# Patient Record
Sex: Female | Born: 1970
Health system: Southern US, Community
[De-identification: ages and names within clinical notes are randomized; demographics above are authoritative.]

## PROBLEM LIST (undated history)

## (undated) DIAGNOSIS — IMO0002 Reserved for concepts with insufficient information to code with codable children: Secondary | ICD-10-CM

## (undated) DIAGNOSIS — K449 Diaphragmatic hernia without obstruction or gangrene: Secondary | ICD-10-CM

## (undated) DIAGNOSIS — K219 Gastro-esophageal reflux disease without esophagitis: Secondary | ICD-10-CM

## (undated) DIAGNOSIS — R87619 Unspecified abnormal cytological findings in specimens from cervix uteri: Secondary | ICD-10-CM

## (undated) DIAGNOSIS — R51 Headache: Secondary | ICD-10-CM

## (undated) DIAGNOSIS — B009 Herpesviral infection, unspecified: Secondary | ICD-10-CM

## (undated) HISTORY — DX: Reserved for concepts with insufficient information to code with codable children: IMO0002

## (undated) HISTORY — DX: Unspecified abnormal cytological findings in specimens from cervix uteri: R87.619

## (undated) HISTORY — DX: Herpesviral infection, unspecified: B00.9

---

## 1991-09-16 HISTORY — PX: CRYOTHERAPY: SHX1416

## 1998-12-03 ENCOUNTER — Other Ambulatory Visit: Admission: RE | Admit: 1998-12-03 | Discharge: 1998-12-03 | Payer: Self-pay | Admitting: Obstetrics and Gynecology

## 2000-11-30 ENCOUNTER — Other Ambulatory Visit: Admission: RE | Admit: 2000-11-30 | Discharge: 2000-11-30 | Payer: Self-pay | Admitting: Obstetrics and Gynecology

## 2001-06-25 ENCOUNTER — Ambulatory Visit (HOSPITAL_COMMUNITY): Admission: RE | Admit: 2001-06-25 | Discharge: 2001-06-25 | Payer: Self-pay | Admitting: Internal Medicine

## 2001-06-25 ENCOUNTER — Encounter: Payer: Self-pay | Admitting: Internal Medicine

## 2001-12-03 ENCOUNTER — Other Ambulatory Visit: Admission: RE | Admit: 2001-12-03 | Discharge: 2001-12-03 | Payer: Self-pay | Admitting: Obstetrics and Gynecology

## 2002-11-29 ENCOUNTER — Observation Stay (HOSPITAL_COMMUNITY): Admission: AD | Admit: 2002-11-29 | Discharge: 2002-11-30 | Payer: Self-pay | Admitting: Obstetrics and Gynecology

## 2003-04-07 ENCOUNTER — Observation Stay (HOSPITAL_COMMUNITY): Admission: RE | Admit: 2003-04-07 | Discharge: 2003-04-07 | Payer: Self-pay | Admitting: Obstetrics and Gynecology

## 2003-04-22 ENCOUNTER — Ambulatory Visit (HOSPITAL_COMMUNITY): Admission: AD | Admit: 2003-04-22 | Discharge: 2003-04-23 | Payer: Self-pay | Admitting: Obstetrics and Gynecology

## 2003-05-07 ENCOUNTER — Inpatient Hospital Stay (HOSPITAL_COMMUNITY): Admission: AD | Admit: 2003-05-07 | Discharge: 2003-05-08 | Payer: Self-pay | Admitting: Obstetrics and Gynecology

## 2003-09-16 HISTORY — PX: LEEP: SHX91

## 2004-12-09 ENCOUNTER — Other Ambulatory Visit: Admission: RE | Admit: 2004-12-09 | Discharge: 2004-12-09 | Payer: Self-pay | Admitting: Obstetrics and Gynecology

## 2005-01-01 ENCOUNTER — Other Ambulatory Visit: Admission: RE | Admit: 2005-01-01 | Discharge: 2005-01-01 | Payer: Self-pay | Admitting: Obstetrics and Gynecology

## 2005-09-26 ENCOUNTER — Other Ambulatory Visit: Admission: RE | Admit: 2005-09-26 | Discharge: 2005-09-26 | Payer: Self-pay | Admitting: Obstetrics & Gynecology

## 2006-02-10 ENCOUNTER — Other Ambulatory Visit: Admission: RE | Admit: 2006-02-10 | Discharge: 2006-02-10 | Payer: Self-pay | Admitting: Obstetrics & Gynecology

## 2006-06-05 ENCOUNTER — Other Ambulatory Visit: Admission: RE | Admit: 2006-06-05 | Discharge: 2006-06-05 | Payer: Self-pay | Admitting: Obstetrics & Gynecology

## 2006-06-08 ENCOUNTER — Ambulatory Visit (HOSPITAL_COMMUNITY): Admission: RE | Admit: 2006-06-08 | Discharge: 2006-06-08 | Payer: Self-pay | Admitting: Family Medicine

## 2006-06-10 ENCOUNTER — Encounter (HOSPITAL_COMMUNITY): Admission: RE | Admit: 2006-06-10 | Discharge: 2006-06-13 | Payer: Self-pay | Admitting: Family Medicine

## 2006-06-23 ENCOUNTER — Ambulatory Visit (HOSPITAL_COMMUNITY): Admission: RE | Admit: 2006-06-23 | Discharge: 2006-06-23 | Payer: Self-pay | Admitting: Family Medicine

## 2006-07-24 ENCOUNTER — Ambulatory Visit (HOSPITAL_COMMUNITY): Admission: RE | Admit: 2006-07-24 | Discharge: 2006-07-24 | Payer: Self-pay | Admitting: General Surgery

## 2006-11-18 ENCOUNTER — Other Ambulatory Visit: Admission: RE | Admit: 2006-11-18 | Discharge: 2006-11-18 | Payer: Self-pay | Admitting: Obstetrics & Gynecology

## 2007-05-26 ENCOUNTER — Other Ambulatory Visit: Admission: RE | Admit: 2007-05-26 | Discharge: 2007-05-26 | Payer: Self-pay | Admitting: Obstetrics & Gynecology

## 2007-11-25 ENCOUNTER — Other Ambulatory Visit: Admission: RE | Admit: 2007-11-25 | Discharge: 2007-11-25 | Payer: Self-pay | Admitting: Obstetrics & Gynecology

## 2008-02-29 ENCOUNTER — Ambulatory Visit (HOSPITAL_COMMUNITY): Admission: RE | Admit: 2008-02-29 | Discharge: 2008-02-29 | Payer: Self-pay | Admitting: Family Medicine

## 2008-12-18 ENCOUNTER — Other Ambulatory Visit: Admission: RE | Admit: 2008-12-18 | Discharge: 2008-12-18 | Payer: Self-pay | Admitting: Obstetrics & Gynecology

## 2011-01-31 NOTE — Op Note (Signed)
   NAME:  Carmen Robles, Carmen Robles                      ACCOUNT NO.:  [id number]   MEDICAL RECORD NO.:  [id number]                   PATIENT TYPE:  INP   LOCATION:  A427                                 FACILITY:  [facility]   PHYSICIAN:  Tilda Burrow, M.D.              DATE OF BIRTH:  [birth year]   DATE OF PROCEDURE:  05/07/2003  DATE OF DISCHARGE:                                 OPERATIVE REPORT   DELIVERY TIME:  3:15 p.m.   DELIVERY SUMMARY:  Ms. Mason Jim progressed nicely through labor.  She was  admitted at approximately 7 a.m., begun on Pitocin by 8 a.m. with amniotomy  approximately 10 a.m.  She was 2 cm dilated, 80%, -2 at that time.  She  progressed steadily through labor, received analgesics at 1 p.m. and reached  completely dilated at approximately 2:30 p.m., delivering at 3:15 by vacuum  assistance from a right occiput anterior position.  The vacuum assistance  was used through two pushes, then removed for crowning and the spontaneous  expulsion of the baby.  The bladder had been in-and-out catheterized shortly  before delivery.  There was a small median second-degree perineal laceration  which was subsequently repaired.  After the infant was delivered, cord  clamped, cut, then cut by the baby's father, and baby placed on maternal  abdomen before transfer to the warmer.  Placenta delivered easily,  Schultze's presentation.  Three-vessel cord confirmed with membranes  apparently intact.  EBL of 500 mL.  Second-degree perineal laceration repair  was without difficulty.                                               Tilda Burrow, M.D.    JVF/MEDQ  D:  05/07/2003  T:  05/07/2003  Job:  [id number]   cc:   Jeoffrey Massed, M.D.  [facility]  [place], [place] [address]  Fax: [phone]

## 2011-01-31 NOTE — Consult Note (Signed)
   NAME:  Carmen Robles, Carmen Robles                      ACCOUNT NO.:  [id number]   MEDICAL RECORD NO.:  [id number]                   PATIENT TYPE:  OIB   LOCATION:  A415                                 FACILITY:  [facility]   PHYSICIAN:  Tilda Burrow, M.D.              DATE OF BIRTH:  1971/02/14   DATE OF CONSULTATION:  DATE OF DISCHARGE:                                   CONSULTATION   OBSERVATION TIME:  9 hours on 04/07/2003.   CHIEF COMPLAINT:  Cramps since midnight and 35-week gestation.   OBSERVATION SUMMARY:  A 40 year old gravida 3, para 1, AB 1 at 35 weeks and  2 days who presented shortly after midline complaining of cramping and seen  to have mild uterine irritability, with urinalysis negative and presumptive  diagnosis of idiopathic preterm uterine irritability.  The patient received  terbutaline subcu and followed with oral tablets.  She responded to the  tocolytics and slept on and off through the night.  She was reassessed in  the morning.  It was felt that she needed additional tocolysis.  She was  taken out of work, given a prescription for Brethine 5 mg p.o. q.6h. x1  week, limited activity at home, and an additional prescription for Ambien 10  mg p.o. q.h.s. p.r.n. sleep, dispense 10.  Follow up in three days in our  office.                                               Tilda Burrow, M.D.    JVF/MEDQ  D:  04/07/2003  T:  04/07/2003  Job:  [id number]

## 2011-01-31 NOTE — H&P (Signed)
NAME:  Carmen Robles, Carmen Robles NO.:  [id number]   MEDICAL RECORD NO.:  [id number]            PATIENT TYPE:   LOCATION:                                 FACILITY:   PHYSICIAN:  Dalia Heading, M.D.  DATE OF BIRTH:  [birth year]   DATE OF ADMISSION:  DATE OF DISCHARGE:  LH                                HISTORY & PHYSICAL   CHIEF COMPLAINT:  Family history of colon carcinoma, hematochezia, GERD.   HISTORY OF PRESENT ILLNESS:  The patient is a 40 year old white female who  was referred for endoscopic evaluation.  She needs a colonoscopy and EGD for  hematochezia, family history of colon carcinoma, and GERD.  She has been  having intermittent hematochezia and diarrhea over the past few months.  Her  father was treated for colon cancer last year.  She is also having  intermittent GERD and epigastric pain.  No weight loss, nausea, vomiting,  diarrhea, constipation, or melena have been noted.  She has never had a  colonoscopy.  She has a known sliding hiatal hernia by CT scan.  She is not  on any therapy.   PAST MEDICAL HISTORY:  As noted above.   PAST SURGICAL HISTORY:  Unremarkable.   CURRENT MEDICATIONS:  Valtrex when needed.   ALLERGIES:  No known drug allergies.   REVIEW OF SYSTEMS:  Noncontributory.   PHYSICAL EXAMINATION:  GENERAL:  The patient is a well-developed, well-  nourished white female in no acute distress.  LUNGS:  Clear to auscultation with equal breath sounds bilaterally.  HEART:  Examination reveals a regular rate and rhythm without history, S4,  murmurs.  ABDOMEN:  Soft, nontender, nondistended.  No hepatosplenomegaly or masses  are noted.  RECTAL:  Examination was deferred to the procedure.   IMPRESSION:  1. Family history of colon carcinoma.  2. Hematochezia.  3. Gastroesophageal reflux disease.   PLAN:  The patient is scheduled for an EGD and colonoscopy on 07/24/2006.  The risks and benefits of the procedure including bleeding and  perforation  were fully explained to the patient who gave informed consent.      Dalia Heading, M.D.  Electronically Signed     MAJ/MEDQ  D:  07/09/2006  T:  07/10/2006  Job:  [id number]   cc:   [facility]  Fax: [phone]   Corrie Mckusick, M.D.  Fax: [phone]

## 2011-08-22 ENCOUNTER — Encounter (HOSPITAL_COMMUNITY): Payer: Self-pay

## 2011-08-28 ENCOUNTER — Encounter (HOSPITAL_COMMUNITY)
Admission: RE | Admit: 2011-08-28 | Discharge: 2011-08-28 | Disposition: A | Discharge status: Home / Self Care | Payer: BC Managed Care – PPO | Source: Ambulatory Visit | Attending: Obstetrics & Gynecology | Admitting: Obstetrics & Gynecology

## 2011-08-28 ENCOUNTER — Other Ambulatory Visit (HOSPITAL_COMMUNITY): Payer: Self-pay

## 2011-08-28 ENCOUNTER — Encounter (HOSPITAL_COMMUNITY): Payer: Self-pay

## 2011-08-28 HISTORY — DX: Gastro-esophageal reflux disease without esophagitis: K21.9

## 2011-08-28 HISTORY — DX: Headache: R51

## 2011-08-28 HISTORY — DX: Diaphragmatic hernia without obstruction or gangrene: K44.9

## 2011-08-28 LAB — SURGICAL PCR SCREEN
MRSA, PCR: NEGATIVE
Staphylococcus aureus: NEGATIVE

## 2011-08-28 LAB — CBC
Hemoglobin: 13.1 g/dL (ref 12.0–15.0)
MCHC: 32.2 g/dL (ref 30.0–36.0)
WBC: 4.9 10*3/uL (ref 4.0–10.5)

## 2011-08-28 NOTE — Patient Instructions (Addendum)
YOUR PROCEDURE IS SCHEDULED ON:09/02/11  ENTER THROUGH THE MAIN ENTRANCE OF [facility] AT:10:15 am   USE DESK PHONE AND DIAL [id number] TO INFORM us OF YOUR ARRIVAL  CALL [phone] IF YOU HAVE ANY QUESTIONS OR PROBLEMS PRIOR TO YOUR ARRIVAL.  REMEMBER: DO NOT EAT OR  AFTER MIDNIGHT : Monday  SPECIAL INSTRUCTIONS: clear liquids until 7am Tuesday   YOU MAY BRUSH YOUR TEETH THE MORNING OF SURGERY   TAKE THESE MEDICINES THE DAY OF SURGERY WITH SIP OF WATER: Omeprazole   DO NOT WEAR JEWELRY, EYE MAKEUP, LIPSTICK OR DARK FINGERNAIL POLISH DO NOT WEAR LOTIONS OR DEODORANT DO NOT SHAVE FOR 48 HOURS PRIOR TO SURGERY  YOU WILL NOT BE ALLOWED TO DRIVE YOURSELF HOME.  NAME OF DRIVER: Dorene Sorrow

## 2011-09-02 ENCOUNTER — Encounter (HOSPITAL_COMMUNITY)
Admission: RE | Disposition: A | Discharge status: Home / Self Care | Payer: Self-pay | Source: Ambulatory Visit | Attending: Obstetrics & Gynecology

## 2011-09-02 ENCOUNTER — Encounter (HOSPITAL_COMMUNITY): Payer: Self-pay | Admitting: *Deleted

## 2011-09-02 ENCOUNTER — Encounter (HOSPITAL_COMMUNITY): Payer: Self-pay | Admitting: Anesthesiology

## 2011-09-02 ENCOUNTER — Other Ambulatory Visit: Payer: Self-pay | Admitting: Obstetrics & Gynecology

## 2011-09-02 ENCOUNTER — Ambulatory Visit (HOSPITAL_COMMUNITY): Payer: BC Managed Care – PPO | Admitting: Anesthesiology

## 2011-09-02 ENCOUNTER — Ambulatory Visit (HOSPITAL_COMMUNITY)
Admission: RE | Admit: 2011-09-02 | Discharge: 2011-09-03 | Disposition: A | Discharge status: Home / Self Care | Payer: BC Managed Care – PPO | Source: Ambulatory Visit | Attending: Obstetrics & Gynecology | Admitting: Obstetrics & Gynecology

## 2011-09-02 DIAGNOSIS — D251 Intramural leiomyoma of uterus: Secondary | ICD-10-CM | POA: Insufficient documentation

## 2011-09-02 DIAGNOSIS — N949 Unspecified condition associated with female genital organs and menstrual cycle: Secondary | ICD-10-CM | POA: Insufficient documentation

## 2011-09-02 DIAGNOSIS — N938 Other specified abnormal uterine and vaginal bleeding: Secondary | ICD-10-CM | POA: Insufficient documentation

## 2011-09-02 DIAGNOSIS — N840 Polyp of corpus uteri: Secondary | ICD-10-CM | POA: Insufficient documentation

## 2011-09-02 DIAGNOSIS — N930 Postcoital and contact bleeding: Secondary | ICD-10-CM

## 2011-09-02 HISTORY — PX: CYSTOSCOPY: SHX5120

## 2011-09-02 HISTORY — PX: LAPAROSCOPIC ASSISTED VAGINAL HYSTERECTOMY: SHX5398

## 2011-09-02 LAB — PREGNANCY, URINE: Preg Test, Ur: NEGATIVE

## 2011-09-02 SURGERY — ROBOTIC ASSISTED TOTAL HYSTERECTOMY
Anesthesia: General | Site: Uterus | Wound class: Clean Contaminated

## 2011-09-02 MED ORDER — CEFAZOLIN SODIUM 1-5 GM-% IV SOLN
INTRAVENOUS | Status: AC
  Filled 2011-09-02: qty 50

## 2011-09-02 MED ORDER — MIDAZOLAM HCL 2 MG/2ML IJ SOLN
INTRAMUSCULAR | Status: AC
  Filled 2011-09-02: qty 2

## 2011-09-02 MED ORDER — FENTANYL CITRATE 0.05 MG/ML IJ SOLN
INTRAMUSCULAR | Status: AC
  Filled 2011-09-02: qty 2

## 2011-09-02 MED ORDER — KETOROLAC TROMETHAMINE 30 MG/ML IJ SOLN: 30.0000 mg | Freq: Four times a day (QID) | INTRAMUSCULAR | Status: DC

## 2011-09-02 MED ORDER — PROPOFOL 10 MG/ML IV EMUL
INTRAVENOUS | Status: AC
  Filled 2011-09-02: qty 20

## 2011-09-02 MED ORDER — ROCURONIUM BROMIDE 50 MG/5ML IV SOLN
INTRAVENOUS | Status: AC
  Filled 2011-09-02: qty 2

## 2011-09-02 MED ORDER — ROCURONIUM BROMIDE 100 MG/10ML IV SOLN
INTRAVENOUS | Status: DC | PRN
  Administered 2011-09-02: 50 mg via INTRAVENOUS
  Administered 2011-09-02: 20 mg via INTRAVENOUS

## 2011-09-02 MED ORDER — DEXTROSE-NACL 5-0.45 % IV SOLN
INTRAVENOUS | Status: DC
  Administered 2011-09-02 – 2011-09-03 (×2): via INTRAVENOUS

## 2011-09-02 MED ORDER — LIDOCAINE HCL (CARDIAC) 20 MG/ML IV SOLN
INTRAVENOUS | Status: AC
  Filled 2011-09-02: qty 5

## 2011-09-02 MED ORDER — PANTOPRAZOLE SODIUM 40 MG PO TBEC
40.0000 mg | DELAYED_RELEASE_TABLET | Freq: Every day | ORAL | Status: DC
  Administered 2011-09-02: 40 mg via ORAL
  Filled 2011-09-02 (×2): qty 1

## 2011-09-02 MED ORDER — KETOROLAC TROMETHAMINE 30 MG/ML IJ SOLN
30.0000 mg | Freq: Four times a day (QID) | INTRAMUSCULAR | Status: DC
  Administered 2011-09-02 – 2011-09-03 (×2): 30 mg via INTRAVENOUS
  Filled 2011-09-02 (×3): qty 1

## 2011-09-02 MED ORDER — PROMETHAZINE HCL 25 MG/ML IJ SOLN: 12.5000 mg | INTRAMUSCULAR | Status: DC | PRN

## 2011-09-02 MED ORDER — STERILE WATER FOR IRRIGATION IR SOLN
Status: DC | PRN
  Administered 2011-09-02: 1000 mL via INTRAVESICAL

## 2011-09-02 MED ORDER — MIDAZOLAM HCL 5 MG/5ML IJ SOLN
INTRAMUSCULAR | Status: DC | PRN
  Administered 2011-09-02: 2 mg via INTRAVENOUS

## 2011-09-02 MED ORDER — MORPHINE SULFATE 2 MG/ML IJ SOLN: 1.0000 mg | INTRAMUSCULAR | Status: DC | PRN

## 2011-09-02 MED ORDER — NEOSTIGMINE METHYLSULFATE 1 MG/ML IJ SOLN
INTRAMUSCULAR | Status: AC
  Filled 2011-09-02: qty 10

## 2011-09-02 MED ORDER — ROPIVACAINE HCL 5 MG/ML IJ SOLN
INTRAMUSCULAR | Status: DC | PRN
  Administered 2011-09-02: 80 mL

## 2011-09-02 MED ORDER — INDIGOTINDISULFONATE SODIUM 8 MG/ML IJ SOLN
INTRAMUSCULAR | Status: AC
  Filled 2011-09-02: qty 5

## 2011-09-02 MED ORDER — ACETAMINOPHEN 325 MG PO TABS: 650.0000 mg | ORAL_TABLET | ORAL | Status: DC | PRN

## 2011-09-02 MED ORDER — PANTOPRAZOLE SODIUM 40 MG IV SOLR: 40.0000 mg | Freq: Every day | INTRAVENOUS | Status: DC

## 2011-09-02 MED ORDER — LIDOCAINE HCL (CARDIAC) 20 MG/ML IV SOLN
INTRAVENOUS | Status: DC | PRN
  Administered 2011-09-02: 40 mg via INTRAVENOUS

## 2011-09-02 MED ORDER — LACTATED RINGERS IV SOLN
INTRAVENOUS | Status: DC | PRN
  Administered 2011-09-02: 12:00:00 via INTRAVENOUS

## 2011-09-02 MED ORDER — ONDANSETRON HCL 4 MG/2ML IJ SOLN
INTRAMUSCULAR | Status: AC
  Filled 2011-09-02: qty 2

## 2011-09-02 MED ORDER — FENTANYL CITRATE 0.05 MG/ML IJ SOLN
INTRAMUSCULAR | Status: DC | PRN
  Administered 2011-09-02 (×2): 50 ug via INTRAVENOUS
  Administered 2011-09-02 (×2): 100 ug via INTRAVENOUS
  Administered 2011-09-02: 50 ug via INTRAVENOUS

## 2011-09-02 MED ORDER — PROPOFOL 10 MG/ML IV EMUL
INTRAVENOUS | Status: DC | PRN
  Administered 2011-09-02: 200 mg via INTRAVENOUS

## 2011-09-02 MED ORDER — ROCURONIUM BROMIDE 50 MG/5ML IV SOLN
INTRAVENOUS | Status: AC
  Filled 2011-09-02: qty 1

## 2011-09-02 MED ORDER — NEOSTIGMINE METHYLSULFATE 1 MG/ML IJ SOLN
INTRAMUSCULAR | Status: DC | PRN
  Administered 2011-09-02: 4 mg via INTRAVENOUS

## 2011-09-02 MED ORDER — INDIGOTINDISULFONATE SODIUM 8 MG/ML IJ SOLN
INTRAMUSCULAR | Status: DC | PRN
  Administered 2011-09-02: 40 mg via INTRAVENOUS

## 2011-09-02 MED ORDER — CEFAZOLIN SODIUM 1-5 GM-% IV SOLN
1.0000 g | INTRAVENOUS | Status: AC
  Administered 2011-09-02: 1 g via INTRAVENOUS

## 2011-09-02 MED ORDER — DEXAMETHASONE SODIUM PHOSPHATE 10 MG/ML IJ SOLN
INTRAMUSCULAR | Status: AC
  Filled 2011-09-02: qty 1

## 2011-09-02 MED ORDER — GLYCOPYRROLATE 0.2 MG/ML IJ SOLN
INTRAMUSCULAR | Status: AC
  Filled 2011-09-02: qty 1

## 2011-09-02 MED ORDER — KETOROLAC TROMETHAMINE 30 MG/ML IJ SOLN
INTRAMUSCULAR | Status: DC | PRN
  Administered 2011-09-02: 30 mg via INTRAVENOUS

## 2011-09-02 MED ORDER — FENTANYL CITRATE 0.05 MG/ML IJ SOLN
INTRAMUSCULAR | Status: AC
  Filled 2011-09-02: qty 5

## 2011-09-02 MED ORDER — DEXAMETHASONE SODIUM PHOSPHATE 4 MG/ML IJ SOLN
INTRAMUSCULAR | Status: DC | PRN
  Administered 2011-09-02: 10 mg via INTRAVENOUS

## 2011-09-02 MED ORDER — GLYCOPYRROLATE 0.2 MG/ML IJ SOLN
INTRAMUSCULAR | Status: DC | PRN
  Administered 2011-09-02: .8 mg via INTRAVENOUS

## 2011-09-02 MED ORDER — OXYCODONE-ACETAMINOPHEN 5-325 MG PO TABS
1.0000 | ORAL_TABLET | ORAL | Status: DC | PRN
  Administered 2011-09-03: 2 via ORAL
  Filled 2011-09-02: qty 2
  Filled 2011-09-02: qty 1

## 2011-09-02 MED ORDER — ONDANSETRON HCL 4 MG/2ML IJ SOLN
INTRAMUSCULAR | Status: DC | PRN
  Administered 2011-09-02: 4 mg via INTRAVENOUS

## 2011-09-02 MED ORDER — MENTHOL 3 MG MT LOZG: 1.0000 | LOZENGE | OROMUCOSAL | Status: DC | PRN

## 2011-09-02 MED ORDER — LACTATED RINGERS IR SOLN
Status: DC | PRN
  Administered 2011-09-02: 3000 mL

## 2011-09-02 MED ORDER — HYDROMORPHONE HCL PF 1 MG/ML IJ SOLN
INTRAMUSCULAR | Status: AC
  Administered 2011-09-02: 0.25 mg via INTRAVENOUS
  Filled 2011-09-02: qty 1

## 2011-09-02 MED ORDER — ARTIFICIAL TEARS OP OINT
TOPICAL_OINTMENT | OPHTHALMIC | Status: DC | PRN
  Administered 2011-09-02: 1 via OPHTHALMIC

## 2011-09-02 MED ORDER — GLYCOPYRROLATE 0.2 MG/ML IJ SOLN
INTRAMUSCULAR | Status: AC
  Filled 2011-09-02: qty 2

## 2011-09-02 MED ORDER — HYDROMORPHONE HCL PF 1 MG/ML IJ SOLN
0.2500 mg | INTRAMUSCULAR | Status: DC | PRN
  Administered 2011-09-02: 0.25 mg via INTRAVENOUS

## 2011-09-02 MED ORDER — TEMAZEPAM 15 MG PO CAPS: 15.0000 mg | ORAL_CAPSULE | Freq: Every evening | ORAL | Status: DC | PRN

## 2011-09-02 SURGICAL SUPPLY — 46 items
BAG URINE DRAINAGE (UROLOGICAL SUPPLIES) ×4 IMPLANT
BARRIER ADHS 3X4 INTERCEED (GAUZE/BANDAGES/DRESSINGS) ×4 IMPLANT
CABLE HIGH FREQUENCY MONO STRZ (ELECTRODE) ×4 IMPLANT
CATH FOLEY 3WAY  5CC 16FR (CATHETERS) ×1
CATH FOLEY 3WAY 5CC 16FR (CATHETERS) ×3 IMPLANT
CLOTH BEACON ORANGE TIMEOUT ST (SAFETY) ×4 IMPLANT
CONT PATH 16OZ SNAP LID 3702 (MISCELLANEOUS) ×4 IMPLANT
COVER MAYO STAND STRL (DRAPES) ×4 IMPLANT
COVER TABLE BACK 60X90 (DRAPES) ×8 IMPLANT
COVER TIP SHEARS 8 DVNC (MISCELLANEOUS) ×3 IMPLANT
COVER TIP SHEARS 8MM DA VINCI (MISCELLANEOUS) ×1
DECANTER SPIKE VIAL GLASS SM (MISCELLANEOUS) ×4 IMPLANT
DRAPE HUG U DISPOSABLE (DRAPE) ×4 IMPLANT
DRAPE LG THREE QUARTER DISP (DRAPES) ×8 IMPLANT
DRAPE MONITOR DA VINCI (DRAPE) IMPLANT
DRAPE WARM FLUID 44X44 (DRAPE) ×4 IMPLANT
ELECT REM PT RETURN 9FT ADLT (ELECTROSURGICAL) ×4
ELECTRODE REM PT RTRN 9FT ADLT (ELECTROSURGICAL) ×3 IMPLANT
EVACUATOR SMOKE 8.L (FILTER) ×4 IMPLANT
GLOVE BIOGEL PI IND STRL 7.0 (GLOVE) ×15 IMPLANT
GLOVE BIOGEL PI INDICATOR 7.0 (GLOVE) ×5
GLOVE ECLIPSE 6.5 STRL STRAW (GLOVE) ×20 IMPLANT
GOWN PREVENTION PLUS LG XLONG (DISPOSABLE) ×12 IMPLANT
KIT ACCESSORY DA VINCI DISP (KITS) ×1
KIT ACCESSORY DVNC DISP (KITS) ×3 IMPLANT
NEEDLE INSUFFLATION 14GA 120MM (NEEDLE) ×4 IMPLANT
NS IRRIG 1000ML POUR BTL (IV SOLUTION) ×12 IMPLANT
OCCLUDER COLPOPNEUMO (BALLOONS) ×4 IMPLANT
PACK LAVH (CUSTOM PROCEDURE TRAY) ×4 IMPLANT
PAD PREP 24X48 CUFFED NSTRL (MISCELLANEOUS) ×8 IMPLANT
PLUG CATH AND CAP STER (CATHETERS) ×4 IMPLANT
SET CYSTO W/LG BORE CLAMP LF (SET/KITS/TRAYS/PACK) ×8 IMPLANT
SOLUTION ELECTROLUBE (MISCELLANEOUS) ×4 IMPLANT
STRIP CLOSURE SKIN 1/4X4 (GAUZE/BANDAGES/DRESSINGS) ×4 IMPLANT
SUT VICRYL 0 UR6 27IN ABS (SUTURE) ×4 IMPLANT
SUT VICRYL RAPIDE 4/0 PS 2 (SUTURE) ×8 IMPLANT
SYR 50ML LL SCALE MARK (SYRINGE) ×4 IMPLANT
TIP UTERINE 6.7X8CM BLUE DISP (MISCELLANEOUS) ×4 IMPLANT
TOWEL OR 17X24 6PK STRL BLUE (TOWEL DISPOSABLE) ×8 IMPLANT
TROCAR DISP BLADELESS 8 DVNC (TROCAR) ×3 IMPLANT
TROCAR DISP BLADELESS 8MM (TROCAR) ×1
TROCAR XCEL 12X100 BLDLESS (ENDOMECHANICALS) ×4 IMPLANT
TROCAR XCEL NON-BLD 5MMX100MML (ENDOMECHANICALS) ×4 IMPLANT
TROCAR Z-THREAD 12X150 (TROCAR) ×4 IMPLANT
TUBING FILTER THERMOFLATOR (ELECTROSURGICAL) ×4 IMPLANT
WARMER LAPAROSCOPE (MISCELLANEOUS) ×4 IMPLANT

## 2011-09-02 NOTE — Transfer of Care (Signed)
Immediate Anesthesia Transfer of Care Note  Patient: Carmen Robles  Procedure(s) Performed:  ROBOTIC ASSISTED TOTAL HYSTERECTOMY; CYSTOSCOPY  Patient Location: PACU  Anesthesia Type: General  Level of Consciousness: awake, alert  and oriented  Airway & Oxygen Therapy: Patient Spontanous Breathing and Patient connected to nasal cannula oxygen  Post-op Assessment: Report given to PACU RN and Post -op Vital signs reviewed and stable  Post vital signs: Reviewed and stable  Complications: No apparent anesthesia complications

## 2011-09-02 NOTE — Op Note (Signed)
09/02/2011  2:20 PM  PATIENT:  Carmen Robles  40 y.o. female G3P2 with DUB and post coital bleeding with negative evaluation with PUS and endometrial biopsy.  Failure of other conservative methods. History of cryo and LEEP to the cervix in the past.  PRE-OPERATIVE DIAGNOSIS:  Dysfunctional Uterne Bleeding, Post coital bleeding  POST-OPERATIVE DIAGNOSIS:  Dysfunctional Uterne Bleeding, Post coital bleeding  PROCEDURE:  Procedure(s): ROBOTIC ASSISTED TOTAL HYSTERECTOMY CYSTOSCOPY  SURGEON:  Alper Guilmette,M [name]  ASSISTANTS: Romine, Cynthia   ANESTHESIA:   general, Dr. Cristela Blue oversaw the case   ESTIMATED BLOOD LOSS: 50cc  BLOOD ADMINISTERED:none   FLUIDS: 1200cc LR  UOP: 300cc clear  SPECIMEN:  Uterus and cervix sent to pathology  DISPOSITION OF SPECIMEN:  PATHOLOGY  FINDINGS: normal pelvis, normal upper abdomen, boggy uterus consistent with adenomyosis  DESCRIPTION OF OPERATION: Patient was taken to the operating room. She is placed in the supine position. Her arms were tucked by the side and her legs placed in the Webster stirrups in the low lithotomy position. She did have SCDs on her lower extremity bilaterally. Patient was comfortable in this position. General endotracheal anesthesia was administered by the anesthesia staff. Timeout was performed. The patient was then prepped on the abdomen with chlor prep and the perineum, inner thighs, and vagina with Betadine x3. A Foley catheter is in place to straight drain the patient was draped in a normal sterile fashion.  Legs are lifted to the high lithotomy position. Heavy weighted speculum was placed in the vagina. The anterior lip of the cervix was visualized and grasped with single-tooth tenaculum. The uterus sounded to 8 cm. A RUMI uterine manipulator was obtained. An 8 cm disposable tip is attached. A vaginal occlusive device was attached and a medium KOH ring was attached. The cervix was then dilated up to #21 [facility] dilator.  The RUMI uterine manipulator was advanced to the cervix into the endometrial cavity and the KOH ring fit well around the cervix. The tip on the RUMI uterine manipulator was inflated with 10 cc of normal saline. The heavy weighted speculum was removed from the vagina.  Legs were lowered to the low lithotomy position and attention was turned to the abdomen. 0.05% ropivacaine mixed one-to-one with normal saline was used to anesthetize the skin. Beneath the umbilicus the skin was anesthetized and a 10 mm skin incision was made. A Veress needle was obtained. The valve was open. The abdomen was elevated and the needle was passed through the abdominal wall layers until a pop through the peritoneum was heard. A syringe of normal saline was attached to this. An aspiration was performed. No blood or fluid was noted. Fluid injected easily into the pelvis. Another aspiration was performed without any blood, fluid, or saline being noted. The syringe was removed and CO2 gas was attached under low flow. Pneumoperitoneum was achieved without difficulty. Once 2.5 liters of gas was in the abdomen the needle was removed. 2 cm below the midclavicular line on the left side and a 5 mm skin incision was made with a #11 blade. Then using a 5 mm diagnostic laparoscope attached an Optiview port was passed through the abdominal wall layers. The pelvis and upper abdomen was surveyed. Findings were noted above. Then through the umbilical incision a #12 bladed trocar was passed under direct visualization. Port sites with #1 and 2 arm of the robot were identified approximately 10 cm lateral to the umbilical incision. The skin was anesthetized with ropivacaine mixture and nicked  with #11 blade. Then #8 nondisposable trochars and ports were placed under direct visualization through these incisions. These ports were used for the #1 and #2 are of the robot. #3 arm was tucked and it was not used during the procedure. 60 cc of the ropivacaine mixture  was then injected into the abdomen the bathe the pelvis during the procedure. At this point, the patient was placed in Trendelenburg. The Trendelenburg was not steep and was only as much as needed to get the bowels out of the pelvis.  The robot was side docked on the right side in a normal standard fashion.  In arm 1, a monopolar scissors with monopolar cautery attached was used and in arm 2, a PK [place] bipolar cautery attached was used. With the uterus on stretch to the left, the ureter was identified. Then the utero-ovarian pedicle was serially clamped, cauterized, and incised. The round ligament on the right was serially clamped, cauterized, and incised. The peritoneum was opened down to the level of the internal os of the cervix and beginning of the bladder flap was created using both sharp and blunt dissection. Posterior peritoneum was taken down to the level of the uterosacral ligament on the right side. The uterine artery on the right was skeletonized at the level of the KOH ring was serially clamped and cauterized. Once I was sure that it was completely cauterized the pedicle was incised. No bleeding was noted.  Then with the uterus on stretch to the right, the left ureter was identified. Then the left uterine ovarian pedicle was serially clamped, cauterized, and incised. The left round ligament was serially clamped, cauterized, and incised. Peritoneum anteriorly was opened and carried down to the level of the internal os. Remainder of the bladder flap was created. Posterior peritoneum was opened and carried down to the level of the uterosacral ligament on the left side. The left uterine artery was skeletonized. It was serially clamped cauterized and incised and the superficial edge of the KOH ring. At this point the uterus was devascularize. The vaginal occlusive device was inflated. The colpotomy was performed starting at the 12:00 position and sent close to the superficial edge of the KOH ring. This  incision was carried around the vaginal mucosa and a clockwise fashion until colpotomy was completed. At this point the uterus was freed and delivered through the vagina.  The instruments and arm 1 and 2 were changed. A medium needle driver and long tip forcep were used in arms 1 and 2, respectively.  The vaginal cuff was closed by starting at the right corner.  A running stitch of a 180-day V-lock suture was used.  Once the cuff was closed, pelvis was irrigated. No bleeding was noted. All pedicles were inspected. The  ureters were peristalsing bilaterally. An Interceed was placed across the vaginal cuff. At this point the procedure was ended. The ports were removed under direct visualization of the laparoscope. Before the midline port was removed the patient was given several deep breaths trying to remove any gas from the abdomen.  The umbilical incision was closed at the fascial layer with figure-of-eight suture of #0 Vicryl. The skin incisions were closed with subcuticular stitch of 3-0 Vicryl. The incisions were cleansed and Dermabond was placed across incisions.  Before the procedure was ended, a cystoscopy was performed. The CRNA give the patient indigo carmine intravenously while I was closing vaginal cuff.  The bladder was intact and appeared normal. There was no suture in the bladder.  Blue urine was seen coming from the ureteral orifices bilaterally.  At this point the procedure was ended. All the fluid was drained from the bladder. The catheter was not reinserted. Sponge, laps, needles, initially counts were correct x2. Patient given 30 mg IV and Toradol this point. She was awake from anesthesia, extubated without difficulty, and taken to the recovery in stable condition.  COUNTS:  YES  PLAN OF CARE: Transfer to PACU

## 2011-09-02 NOTE — Anesthesia Postprocedure Evaluation (Signed)
Anesthesia Post Note  Patient: Carmen Robles  Procedure(s) Performed:  ROBOTIC ASSISTED TOTAL HYSTERECTOMY; CYSTOSCOPY  Anesthesia type: General  Patient location: PACU  Post pain: Pain level controlled  Post assessment: Post-op Vital signs reviewed  Last Vitals:  Filed Vitals:   09/02/11 1530  BP: 103/58  Pulse: 85  Temp:   Resp: 16    Post vital signs: Reviewed  Level of consciousness: sedated  Complications: No apparent anesthesia complications

## 2011-09-02 NOTE — H&P (Signed)
Carmen Robles is an 40 y.o. female G3P2 here for definitive management of DUB and post coital bleeding.  She has undergone and ultrasound (normal), endometrial biopsy (normal), and Pap smear (normal).  She has been unable to tolerate OCPs' due to side effects.  At times, the post coital bleeding is very heavy and bright red.  She is completely sick of this and ready for definitive management.  She is here for hysterectomy, robotic assited.  Pertinent Gynecological History: Menses: irregular Bleeding: dysfunctional uterine bleeding and post coital bleeding Contraception: vasectomy DES exposure: denies Blood transfusions: none Sexually transmitted diseases: no past history Previous GYN Procedures: cryo and LEEP  Last mammogram: normal Date: 4/08   Last pap: normal Date: 5/12 OB History: G3, P2   Menstrual History: Menarche age: [age] No LMP recorded.    Past Medical History  Diagnosis Date  . GERD (gastroesophageal reflux disease)   . Headache   . Hiatal hernia     Past Surgical History  Procedure Date  . Colonoscopy   . Leep 2005    History reviewed. No pertinent family history.  Social History:  reports that she has quit smoking. She does not have any smokeless tobacco history on file. She reports that she drinks alcohol. She reports that she does not use illicit drugs.  Allergies: No Known Allergies  Prescriptions prior to admission  Medication Sig Dispense Refill  . cholecalciferol (VITAMIN D) 1000 UNITS tablet Take 1,000 Units by mouth daily.        Marland Kitchen omeprazole (PRILOSEC) 20 MG capsule Take 20 mg by mouth daily as needed. For acid reflux       . OVER THE COUNTER MEDICATION Take 1 tablet by mouth daily. Patient takes OTC Vitamin B12 tablets- unsure of the strength       . OVER THE COUNTER MEDICATION Take 1 tablet by mouth daily. Patient takes OTC folic acid tablet- unsure of the strength         Review of Systems  Constitutional: Negative for fever and chills.    Eyes: Negative for blurred vision and double vision.  Respiratory: Negative for cough.   Cardiovascular: Negative for chest pain and palpitations.  Gastrointestinal: Negative for heartburn and nausea.  Genitourinary: Negative for dysuria.  Musculoskeletal: Negative for myalgias.  Skin: Negative for rash.  Neurological: Negative for dizziness and headaches.  Endo/Heme/Allergies: Does not bruise/bleed easily.  Psychiatric/Behavioral: Negative for depression.    Blood pressure 110/66, pulse 84, temperature 97.9 F (36.6 C), temperature source Oral, resp. rate 18, SpO2 100.00%. Physical Exam  Constitutional: She is oriented to person, place, and time. She appears well-developed and well-nourished.  HENT:  Head: Normocephalic and atraumatic.  Eyes: Pupils are equal, round, and reactive to light.  Neck: Normal range of motion. Neck supple.  Cardiovascular: Normal rate, regular rhythm and normal heart sounds.   Respiratory: Effort normal and breath sounds normal.  GI: Soft. Bowel sounds are normal. She exhibits no distension. There is no tenderness.  Genitourinary: Vagina normal and uterus normal.  Musculoskeletal: Normal range of motion.  Neurological: She is alert and oriented to person, place, and time.  Skin: Skin is warm and dry.  Psychiatric: She has a normal mood and affect.    Results for orders placed during the hospital encounter of 09/02/11 (from the past 24 hour(s))  PREGNANCY, URINE     Status: Normal   Collection Time   09/02/11 10:03 AM      Component Value Range   Preg  Test, Ur NEGATIVE      No results found.  Assessment/Plan: 40 year old G3P2 MWF here for TLH due to DUB and post coital bleeding, with negative evaluation.  Risks and benefits all discussed in my office and are documented in patient's chart.  Patient and her spouse are here and we confirmed the procedure--TLH with possible BSO only if these is some unexpected abnormality found with  surgery.  Carmen Robles,M [name] 09/02/2011, 11:08 AM

## 2011-09-02 NOTE — Progress Notes (Signed)
Day of Surgery Procedure(s): ROBOTIC ASSISTED TOTAL HYSTERECTOMY CYSTOSCOPY  Subjective: Patient reports nausea.    Objective: I have reviewed patient's vital signs and intake and output.  General: alert and cooperative Resp: clear to auscultation bilaterally Cardio: tachy but regular rhythm GI: soft, non-tender; bowel sounds normal; no masses,  no organomegaly Extremities: extremities normal, atraumatic, no cyanosis or edema Vaginal Bleeding: none Inc:  C/D/I  Assessment: s/p Procedure(s): ROBOTIC ASSISTED TOTAL HYSTERECTOMY CYSTOSCOPY: stable and progressing well  Plan: Advance diet Encourage ambulation K pad  LOS: 0 days    Carmen Robles,M [name] 09/02/2011, 7:15 PM

## 2011-09-02 NOTE — Anesthesia Preprocedure Evaluation (Addendum)
Anesthesia Evaluation    Airway       Dental  (+)    Pulmonary          Cardiovascular     Neuro/Psych    GI/Hepatic GERD-  Medicated and Controlled,  Endo/Other    Renal/GU      Musculoskeletal   Abdominal   Peds  Hematology   Anesthesia Other Findings   Reproductive/Obstetrics                          Anesthesia Physical Anesthesia Plan  ASA: II  Anesthesia Plan: General   Post-op Pain Management:    Induction: Intravenous  Airway Management Planned: Oral ETT  Additional Equipment:   Intra-op Plan:   Post-operative Plan:   Informed Consent: I have reviewed the patients History and Physical, chart, labs and discussed the procedure including the risks, benefits and alternatives for the proposed anesthesia with the patient or authorized representative who has indicated his/her understanding and acceptance.   Dental Advisory Given and Dental advisory given  Plan Discussed with: CRNA and Surgeon  Anesthesia Plan Comments: (  Discussed  general anesthesia, including possible nausea, instrumentation of airway, sore throat,pulmonary aspiration, etc. I asked if the were any outstanding questions, or  concerns before we proceeded. )        Anesthesia Quick Evaluation

## 2011-09-03 ENCOUNTER — Encounter (HOSPITAL_COMMUNITY): Payer: Self-pay | Admitting: Obstetrics & Gynecology

## 2011-09-03 LAB — CBC
HCT: 32.4 % — ABNORMAL LOW (ref 36.0–46.0)
MCH: 28.3 pg (ref 26.0–34.0)
MCHC: 33 g/dL (ref 30.0–36.0)
MCV: 85.7 fL (ref 78.0–100.0)
RDW: 13.4 % (ref 11.5–15.5)

## 2011-09-03 MED ORDER — KETOROLAC TROMETHAMINE 30 MG/ML IJ SOLN: 30.0000 mg | Freq: Once | INTRAMUSCULAR | Status: DC

## 2011-09-03 MED ORDER — KETOROLAC TROMETHAMINE 30 MG/ML IJ SOLN
30.0000 mg | Freq: Once | INTRAMUSCULAR | Status: AC
  Administered 2011-09-03: 30 mg via INTRAVENOUS

## 2011-09-03 NOTE — Progress Notes (Signed)
Sw referral made in error, as per RN.

## 2011-09-03 NOTE — Discharge Summary (Signed)
Patient ID: Carmen Robles [id number]  Admit date: 09/02/2011 10:00 AM  Discharge date: No discharge date for patient encounter.  Will be later today--09/03/2011  Admission Diagnoses: DUB, post coital bleeding  Discharge Diagnoses:  Same as Admission Diagnosis  Discharged Condition: good  Hospital Course: Patient admitted through same day surgery.  She was taken to the OR where TLH/cystoscopy was performed.  EBL was 50cc.  Surgery was without complications.  Patient transferred to the PACU and then to the third floor for the remainder of her hospitalization.  Patient has used Toradol for pain management.  She was seen in the evening of POD #0 and in the morning of POD #1.  VSS/AF.  UOP adequate.  Foley catheter was removed.  She was transitioned to regular diet and to oral pain meds.  She refused most pain medications so in the AM of POD#1, she was counseled about the need for adequate pain control.  She was able to ambulate without difficulty.  Post op hemoglobin was 10.7.  At this time I feel she is ready for discharge to home with family.  Consults: none  Significant Diagnostic Studies: labs: post op hb 10.7  Treatments: surgery: TLH/cystocopy  Discharge Exam: See last exam in POD#1 note.  Disposition: Home or Self Care  Signed: Lum Keas 04/02/2011, 6:26 PM

## 2011-09-03 NOTE — Progress Notes (Signed)
Post Operative Daily Note  1 Day Post-Op Procedure(s) (LRB): ROBOTIC ASSISTED TOTAL HYSTERECTOMY (N/A) CYSTOSCOPY (N/A)  Subjective: Patient reports no nausea or emesis.  She has refused pain medication through the night except one dose of Toradol and now she is having increased pain.  Objective: I have reviewed patient's vital signs.  vital signs, intake and output, medications and labs.  EXAM General: alert and cooperative Resp: clear to auscultation bilaterally Cardio: regular rate and rhythm, S1, S2 normal, no murmur, click, rub or gallop GI: soft, non-tender; bowel sounds normal; no masses,  no organomegaly Extremities: extremities normal, atraumatic, no cyanosis or edema Vaginal Bleeding: none Inc:  Clean, dry, intact  Assessment: s/p Procedure(s): ROBOTIC ASSISTED TOTAL HYSTERECTOMY CYSTOSCOPY: stable and progressing well  Plan: Advance diet Encourage ambulation Advance to PO medication Discontinue IV fluids Discharge home  LOS: 1 day    Lum Keas, MD 09/03/2011 7:42 AM

## 2012-02-05 ENCOUNTER — Ambulatory Visit: Payer: BC Managed Care – PPO | Attending: Obstetrics & Gynecology | Admitting: Physical Therapy

## 2012-02-05 DIAGNOSIS — M25659 Stiffness of unspecified hip, not elsewhere classified: Secondary | ICD-10-CM | POA: Insufficient documentation

## 2012-02-05 DIAGNOSIS — M545 Low back pain, unspecified: Secondary | ICD-10-CM | POA: Insufficient documentation

## 2012-02-05 DIAGNOSIS — IMO0001 Reserved for inherently not codable concepts without codable children: Secondary | ICD-10-CM | POA: Insufficient documentation

## 2012-02-17 ENCOUNTER — Ambulatory Visit: Payer: BC Managed Care – PPO | Attending: Obstetrics & Gynecology | Admitting: Physical Therapy

## 2012-02-17 DIAGNOSIS — IMO0001 Reserved for inherently not codable concepts without codable children: Secondary | ICD-10-CM | POA: Insufficient documentation

## 2012-02-17 DIAGNOSIS — M25659 Stiffness of unspecified hip, not elsewhere classified: Secondary | ICD-10-CM | POA: Insufficient documentation

## 2012-02-17 DIAGNOSIS — M545 Low back pain, unspecified: Secondary | ICD-10-CM | POA: Insufficient documentation

## 2012-02-19 ENCOUNTER — Ambulatory Visit: Payer: BC Managed Care – PPO | Admitting: Physical Therapy

## 2012-02-24 ENCOUNTER — Encounter: Payer: BC Managed Care – PPO | Admitting: Physical Therapy

## 2012-02-26 ENCOUNTER — Ambulatory Visit: Payer: BC Managed Care – PPO | Admitting: Physical Therapy

## 2012-03-04 ENCOUNTER — Ambulatory Visit: Payer: BC Managed Care – PPO | Admitting: Physical Therapy

## 2012-03-09 ENCOUNTER — Ambulatory Visit: Payer: BC Managed Care – PPO | Admitting: Physical Therapy

## 2012-03-11 ENCOUNTER — Encounter: Payer: BC Managed Care – PPO | Admitting: Physical Therapy

## 2012-03-16 ENCOUNTER — Ambulatory Visit: Payer: BC Managed Care – PPO | Attending: Obstetrics & Gynecology | Admitting: Physical Therapy

## 2012-03-16 DIAGNOSIS — M25659 Stiffness of unspecified hip, not elsewhere classified: Secondary | ICD-10-CM | POA: Insufficient documentation

## 2012-03-16 DIAGNOSIS — M545 Low back pain, unspecified: Secondary | ICD-10-CM | POA: Insufficient documentation

## 2012-03-16 DIAGNOSIS — IMO0001 Reserved for inherently not codable concepts without codable children: Secondary | ICD-10-CM | POA: Insufficient documentation

## 2012-03-25 ENCOUNTER — Ambulatory Visit: Payer: BC Managed Care – PPO | Admitting: Physical Therapy

## 2012-03-30 ENCOUNTER — Ambulatory Visit: Payer: BC Managed Care – PPO | Admitting: Physical Therapy

## 2012-05-11 ENCOUNTER — Other Ambulatory Visit (HOSPITAL_COMMUNITY): Payer: Self-pay | Admitting: Family Medicine

## 2012-05-11 ENCOUNTER — Ambulatory Visit (HOSPITAL_COMMUNITY)
Admission: RE | Admit: 2012-05-11 | Discharge: 2012-05-11 | Disposition: A | Discharge status: Home / Self Care | Payer: BC Managed Care – PPO | Source: Ambulatory Visit | Attending: Family Medicine | Admitting: Family Medicine

## 2012-05-11 DIAGNOSIS — M25559 Pain in unspecified hip: Secondary | ICD-10-CM

## 2012-05-20 ENCOUNTER — Emergency Department (HOSPITAL_COMMUNITY): Payer: BC Managed Care – PPO

## 2012-05-20 ENCOUNTER — Encounter (HOSPITAL_COMMUNITY): Payer: Self-pay | Admitting: *Deleted

## 2012-05-20 ENCOUNTER — Emergency Department (HOSPITAL_COMMUNITY)
Admission: EM | Admit: 2012-05-20 | Discharge: 2012-05-20 | Disposition: A | Discharge status: Home / Self Care | Payer: BC Managed Care – PPO | Attending: Emergency Medicine | Admitting: Emergency Medicine

## 2012-05-20 DIAGNOSIS — Z87891 Personal history of nicotine dependence: Secondary | ICD-10-CM | POA: Insufficient documentation

## 2012-05-20 DIAGNOSIS — R209 Unspecified disturbances of skin sensation: Secondary | ICD-10-CM | POA: Insufficient documentation

## 2012-05-20 DIAGNOSIS — K219 Gastro-esophageal reflux disease without esophagitis: Secondary | ICD-10-CM | POA: Insufficient documentation

## 2012-05-20 DIAGNOSIS — R079 Chest pain, unspecified: Secondary | ICD-10-CM | POA: Insufficient documentation

## 2012-05-20 LAB — CBC
MCHC: 33.4 g/dL (ref 30.0–36.0)
MCV: 85.3 fL (ref 78.0–100.0)
Platelets: 258 10*3/uL (ref 150–400)
RDW: 13.5 % (ref 11.5–15.5)
WBC: 12.5 10*3/uL — ABNORMAL HIGH (ref 4.0–10.5)

## 2012-05-20 LAB — BASIC METABOLIC PANEL
BUN: 13 mg/dL (ref 6–23)
Chloride: 100 mEq/L (ref 96–112)
Creatinine, Ser: 1.02 mg/dL (ref 0.50–1.10)
GFR calc Af Amer: 79 mL/min — ABNORMAL LOW (ref >90)
GFR calc non Af Amer: 68 mL/min — ABNORMAL LOW (ref >90)
Potassium: 3.4 mEq/L — ABNORMAL LOW (ref 3.5–5.1)

## 2012-05-20 LAB — TROPONIN I: Troponin I: <0.3 ng/mL (ref <0.30)

## 2012-05-20 MED ORDER — IOHEXOL 350 MG/ML SOLN
100.0000 mL | Freq: Once | INTRAVENOUS | Status: AC | PRN
  Administered 2012-05-20: 100 mL via INTRAVENOUS

## 2012-05-20 NOTE — ED Notes (Signed)
Left sided cp x 5 days with dizziness, headache, and difficulty taking deep breath.  Also reporting pain in left shoulder.

## 2012-05-20 NOTE — ED Provider Notes (Signed)
History  This chart was scribed for Benny Lennert, MD by Ladona Ridgel Day. This patient was seen in room APA14/APA14 and the patient's care was started at 1340.   CSN: [id number]  Arrival date & time 05/20/12  1340   First MD Initiated Contact with Patient 05/20/12 1712      Chief Complaint  Patient presents with  . Chest Pain   Patient is a 41 y.o. female presenting with chest pain. The history is provided by the patient. No language interpreter was used.  Chest Pain The chest pain began 5 - 7 days ago. Duration of episode(s) is 5 days. Chest pain occurs constantly. The chest pain is resolved. The pain is associated with stress. The severity of the pain is moderate. The quality of the pain is described as tightness. The pain radiates to the left neck, left jaw, left shoulder and left arm. Primary symptoms include shortness of breath. Pertinent negatives for primary symptoms include no fatigue, no cough and no abdominal pain.  Associated symptoms include numbness (Left arm/shoulder). She tried nothing for the symptoms. Risk factors include stress.  Pertinent negatives for past medical history include no seizures.  Procedure history is negative for exercise treadmill test.    Carmen Robles is a 41 y.o. female who presents to the Emergency Department complaining of left sided chest pain which began 5 days ago. She denies any current chest pain here in the ED but does c/o mild SOB. She states this pain was associated with fatigue, stress and left arm/shoulder numbness. She also experienced some discomfort in her neck, and left jaw 5 days ago but resolved after the first day. She states she saw her PCP who recommended an evaluation here at the ED for her chest pain. She has never had a stress test and denies any history of heart problems in her family.    PCP Dr. Renette Butters  Past Medical History  Diagnosis Date  . GERD (gastroesophageal reflux disease)   . Headache   . Hiatal hernia     Past  Surgical History  Procedure Date  . Colonoscopy   . Leep 2005  . Cystoscopy 09/02/2011    Procedure: CYSTOSCOPY;  Surgeon: Lum Keas;  Location: [facility];  Service: Gynecology;  Laterality: N/A;    No family history on file.  History  Substance Use Topics  . Smoking status: Former Games developer  . Smokeless tobacco: Not on file  . Alcohol Use: Yes     socially    OB History    Grav Para Term Preterm Abortions TAB SAB Ect Mult Living                  Review of Systems  Constitutional: Negative for fatigue.  HENT: Positive for neck pain. Negative for congestion, sinus pressure and ear discharge.   Eyes: Negative for discharge.  Respiratory: Positive for chest tightness and shortness of breath. Negative for cough.   Cardiovascular: Positive for chest pain.  Gastrointestinal: Negative for abdominal pain and diarrhea.  Genitourinary: Negative for frequency and hematuria.  Musculoskeletal: Negative for back pain.  Skin: Negative for rash.  Neurological: Positive for numbness (Left arm/shoulder). Negative for seizures and headaches.  Hematological: Negative.   Psychiatric/Behavioral: Negative for hallucinations.  All other systems reviewed and are negative.    Allergies  Review of patient's allergies indicates no known allergies.  Home Medications   Current Outpatient Rx  Name Route Sig Dispense Refill  . OMEPRAZOLE 20 MG PO CPDR  Oral Take 20 mg by mouth daily as needed. For acid reflux      Triage Vitals: BP 122/75  Pulse 87  Temp 98.5 F (36.9 C) (Oral)  Resp 16  Ht 5' (1.524 m)  Wt 145 lb (65.772 kg)  BMI 28.32 kg/m2  SpO2 99%  LMP 08/18/2011  Physical Exam  Constitutional: She is oriented to person, place, and time. She appears well-developed.  HENT:  Head: Normocephalic and atraumatic.  Eyes: Conjunctivae and EOM are normal. No scleral icterus.  Neck: Neck supple. No thyromegaly present.  Cardiovascular: Normal rate and regular rhythm.  Exam reveals no  gallop and no friction rub.   No murmur heard. Pulmonary/Chest: No stridor. She has no wheezes. She has no rales. She exhibits no tenderness.  Abdominal: She exhibits no distension. There is no tenderness. There is no rebound.  Musculoskeletal: Normal range of motion. She exhibits no edema.  Lymphadenopathy:    She has no cervical adenopathy.  Neurological: She is oriented to person, place, and time. Coordination normal.  Skin: No rash noted. No erythema.  Psychiatric: She has a normal mood and affect. Her behavior is normal.    ED Course  Procedures (including critical care time) DIAGNOSTIC STUDIES: Oxygen Saturation is 99% on room air, normal by my interpretation.    COORDINATION OF CARE: At 540 PM Discussed treatment plan with patient which includes EKG, blood work, heart markers, and CXR. Patient agrees.   Labs Reviewed  CBC - Abnormal; Notable for the following:    WBC 12.5 (*)     All other components within normal limits  BASIC METABOLIC PANEL - Abnormal; Notable for the following:    Potassium 3.4 (*)     Glucose, Bld 121 (*)     GFR calc non Af Amer 68 (*)     GFR calc Af Amer 79 (*)     All other components within normal limits  D-DIMER, QUANTITATIVE - Abnormal; Notable for the following:    D-Dimer, Quant 0.85 (*)     All other components within normal limits  TROPONIN I  TROPONIN I   Dg Chest 2 View  05/20/2012  *RADIOLOGY REPORT*  Clinical Data: Shortness of breath.  Chest pain.  CHEST - 2 VIEW  Comparison:  None.  Findings:  The heart size and mediastinal contours are within normal limits.  Both lungs are clear.  The visualized skeletal structures are unremarkable.  IMPRESSION: No active cardiopulmonary disease.   Original Report Authenticated By: Danae Orleans, M.D.      No diagnosis found.    MDM  The chart was scribed for me under my direct supervision.  I personally performed the history, physical, and medical decision making and all procedures in the  evaluation of this patient.Benny Lennert, MD 05/20/12 2151

## 2012-05-20 NOTE — ED Notes (Signed)
Discharge instructions given and reviewed with patient.  Patient verbalized understanding to follow up with her PMD as scheduled.  Patient ambulatory; discharged home in good condition.

## 2012-05-20 NOTE — ED Provider Notes (Signed)
LWBS not in room when other provider tried to see Pt sometime in last hour or two or when I went by room now.[phone]  Hurman Horn, MD 05/20/12 [phone]

## 2012-08-28 ENCOUNTER — Encounter: Payer: Self-pay | Admitting: *Deleted

## 2012-12-01 ENCOUNTER — Encounter: Payer: Self-pay | Admitting: Obstetrics & Gynecology

## 2012-12-01 ENCOUNTER — Ambulatory Visit (INDEPENDENT_AMBULATORY_CARE_PROVIDER_SITE_OTHER): Payer: BC Managed Care – PPO | Admitting: Obstetrics & Gynecology

## 2012-12-01 ENCOUNTER — Other Ambulatory Visit: Payer: Self-pay | Admitting: Obstetrics & Gynecology

## 2012-12-01 VITALS — BP 118/78 | Ht 61.0 in | Wt 148.6 lb

## 2012-12-01 DIAGNOSIS — R5381 Other malaise: Secondary | ICD-10-CM

## 2012-12-01 DIAGNOSIS — Z0000 Encounter for general adult medical examination without abnormal findings: Secondary | ICD-10-CM

## 2012-12-01 DIAGNOSIS — Z01419 Encounter for gynecological examination (general) (routine) without abnormal findings: Secondary | ICD-10-CM | POA: Insufficient documentation

## 2012-12-01 DIAGNOSIS — R5383 Other fatigue: Secondary | ICD-10-CM

## 2012-12-01 LAB — LIPID PANEL
HDL: 71 mg/dL (ref >39)
LDL Cholesterol: 106 mg/dL — ABNORMAL HIGH (ref 0–99)
Total CHOL/HDL Ratio: 2.9 Ratio
Triglycerides: 136 mg/dL (ref <150)
VLDL: 27 mg/dL (ref 0–40)

## 2012-12-01 LAB — HEMOGLOBIN, FINGERSTICK: Hemoglobin, fingerstick: 12.8 g/dL (ref 12.0–16.0)

## 2012-12-01 LAB — POCT URINALYSIS DIPSTICK
Bilirubin, UA: NEGATIVE
Blood, UA: NEGATIVE
Ketones, UA: NEGATIVE
Leukocytes, UA: NEGATIVE
Protein, UA: NEGATIVE
pH, UA: 7

## 2012-12-01 MED ORDER — VALACYCLOVIR HCL 500 MG PO TABS: 500.0000 mg | ORAL_TABLET | ORAL | Status: DC | PRN

## 2012-12-01 NOTE — Progress Notes (Signed)
42 y.o. [id number] MarriedCaucasianF here for annual exam.  She is doing well.  Family is well.  Husband just diagnosed with diabetes.  She complains of fatigue.  Patient's last menstrual period was 08/18/2011.          Sexually active: yes  The current method of family planning is status post hysterectomy.    Exercising: yes  Home exercise routine includes running.  Health Maintenance: Pap:  n/a MMG:  Knows to start.  Will do in [place]. Colonoscopy:  2007.  Father had colon cancer age 44.  BMD:   N/a Tdap:  2008   reports that she has quit smoking. She has never used smokeless tobacco. She reports that  drinks alcohol. She reports that she does not use illicit drugs.  Family History  Problem Relation Age of Onset  . Breast cancer Mother   . Colon cancer Father 9   family history corrected.  Breast cancer present in two mat aunts, not mother  Past Medical History  Diagnosis Date  . GERD (gastroesophageal reflux disease)   . Headache   . Hiatal hernia   . HSV (herpes simplex virus) infection   . Abnormal Pap smear   . Vulvar cancer     history, s/p treatment    Past Surgical History  Procedure Laterality Date  . Colonoscopy    . Leep  2005  . Cystoscopy  09/02/2011    Procedure: CYSTOSCOPY;  Surgeon: Lum Keas;  Location: [facility];  Service: Gynecology;  Laterality: N/A;  . Laparoscopic assisted vaginal hysterectomy  09/02/2011    Robotic/MSM   . Cryotherapy  1993    cervix  . Abdominal hysterectomy      Current Outpatient Prescriptions  Medication Sig Dispense Refill  . Cyanocobalamin (VITAMIN B 12 PO) Take by mouth.      . DHA-EPA-Vitamin E (OMEGA-3 COMPLEX PO) Take by mouth daily.      Marland Kitchen FOLIC ACID PO Take by mouth.      . Ibuprofen (ADVIL) 200 MG CAPS Take by mouth.      Marland Kitchen omeprazole (PRILOSEC) 20 MG capsule Take 20 mg by mouth daily as needed. For acid reflux      . valACYclovir (VALTREX) 500 MG tablet Take 500 mg by mouth as needed.      Marland Kitchen VITAMIN D,  CHOLECALCIFEROL, PO Take by mouth.       No current facility-administered medications for this visit.    ROS: Pertinent items are noted in HPI.  Exam:    BP 118/78  Ht 5\' 1"  (1.549 m)  Wt 148 lb 9.6 oz (67.405 kg)  BMI 28.09 kg/m2  LMP 08/18/2011  Height:   Height: 5\' 1"  (154.9 cm)  Ht Readings from Last 3 Encounters:  12/01/12 5\' 1"  (1.549 m)  05/20/12 5' (1.524 m)  09/02/11 5\' 5"  (1.651 m)    General appearance: alert, cooperative and appears stated age Head: Normocephalic, without obvious abnormality, atraumatic Neck: no adenopathy, supple, symmetrical, trachea midline and thyroid not enlarged, symmetric, no tenderness/mass/nodules Lungs: clear to auscultation bilaterally Breasts: Inspection negative, No nipple retraction or dimpling, No nipple discharge or bleeding, No axillary or supraclavicular adenopathy, Normal to palpation without dominant masses Heart: regular rate and rhythm Abdomen: soft, non-tender; bowel sounds normal; no masses,  no organomegaly Extremities: extremities normal, atraumatic, no cyanosis or edema Skin: Skin color, texture, turgor normal. No rashes or lesions Lymph nodes: Cervical, supraclavicular, and axillary nodes normal. No abnormal inguinal nodes palpated Neurologic: Grossly normal  Pelvic: External genitalia:  no lesions              Urethra:  normal appearing urethra with no masses, tenderness or lesions              Bartholins and Skenes: normal                 Vagina: normal appearing vagina with normal color and discharge, no lesions              Cervix: absent              Pap taken: no Bimanual Exam:  Uterus:  absent               Adnexa: normal adnexa in size, nontender and no masses               Rectovaginal: Confirms               Anus:  normal sphincter tone, no lesions  A:  Well Woman with normal exam  P:   1.  SBEs encouraged.  Regular mammography encouraged starting at age 33.  Order given for pt to do in [place].  2.   Medication prescriptions given--Valtrex.   An After Visit Summary was printed and given to the patient.

## 2012-12-07 IMAGING — CT CT ANGIO CHEST
1 of 6 series · 5 of 36 positions shown · IV contrast (Omnipaque 300)
Comparison: Chest radiographs same date.  Abdominal CT 02/29/2008.

CLINICAL DATA: Left-sided chest pain with shortness of breath for 1
week.  Question pulmonary embolism.

CT ANGIOGRAPHY CHEST
TECHNIQUE: Multidetector CT imaging of the chest using the
standard protocol during bolus administration of intravenous
contrast. Multiplanar reconstructed images including MIPs were
obtained and reviewed to evaluate the vascular anatomy.
Contrast: 100mL OMNIPAQUE IOHEXOL 350 MG/ML SOLN

[Series 4: pe 3.0 b40f · axial · 0.57mm/px · z∈[-222,-84]mm · 5 of 70 slices shown]
[im 12/70  lung]
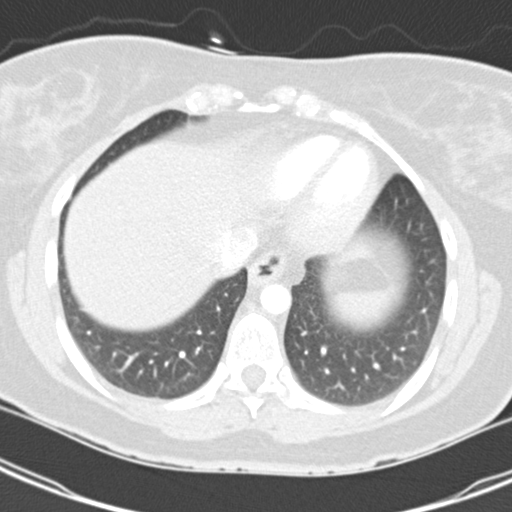
[im 24/70  mediastinal]
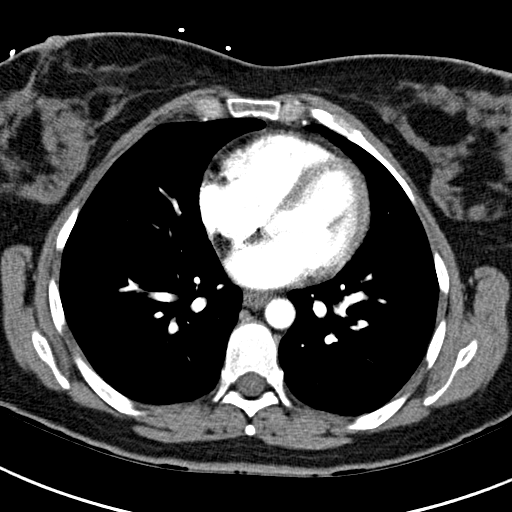
[im 35/70  lung]
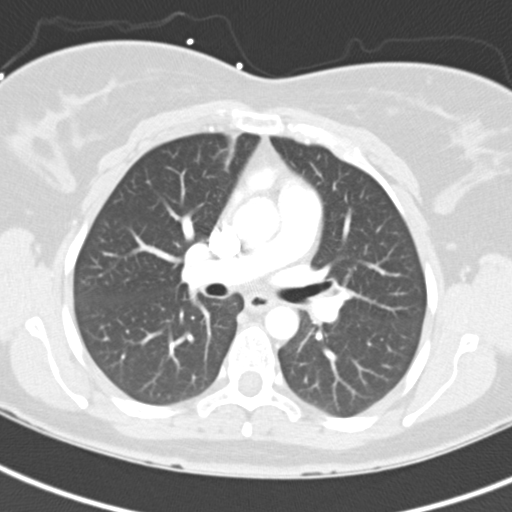
[im 47/70  mediastinal]
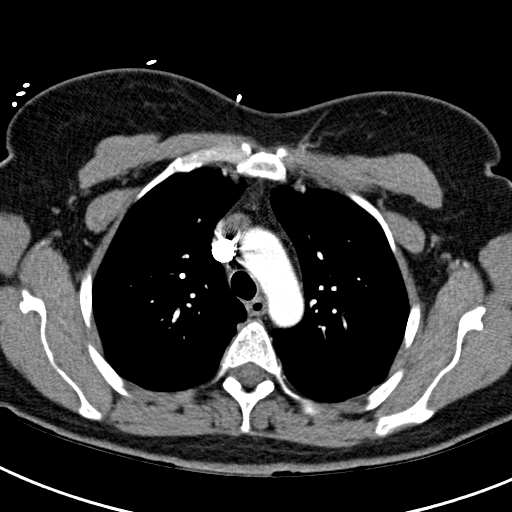
[im 58/70  lung]
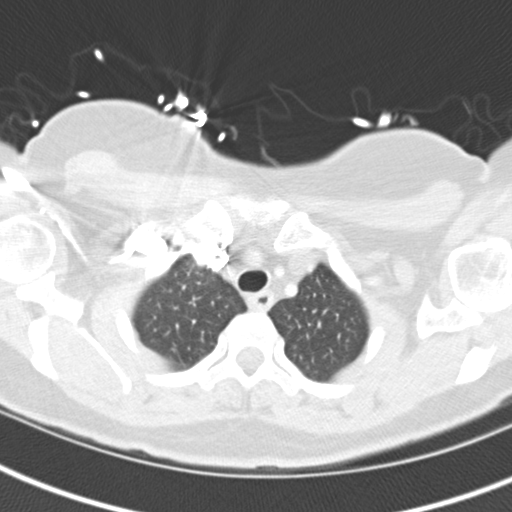

[5 of 36 positions shown; findings below may reference images not displayed]

FINDINGS: The pulmonary arteries are well opacified with contrast.
There is no evidence of acute pulmonary embolism.  The thoracic
aorta appears normal.  The great vessels and coronary arteries
demonstrate no significant findings.

There are no enlarged mediastinal or hilar lymph nodes.  There is a
small hiatal hernia.

There is no pleural or pericardial effusion.  The lungs are clear.

There are no significant findings within the visualized upper
abdomen.  The osseous structures appear normal.
IMPRESSION: No evidence of acute pulmonary embolism or other acute chest
process.

## 2013-07-21 ENCOUNTER — Other Ambulatory Visit: Payer: Self-pay

## 2014-01-12 ENCOUNTER — Encounter: Payer: Self-pay | Admitting: Obstetrics & Gynecology

## 2014-01-12 ENCOUNTER — Ambulatory Visit (INDEPENDENT_AMBULATORY_CARE_PROVIDER_SITE_OTHER): Payer: BC Managed Care – PPO | Admitting: Obstetrics & Gynecology

## 2014-01-12 VITALS — BP 122/78 | HR 76 | Resp 18 | Ht 60.75 in | Wt 143.0 lb

## 2014-01-12 DIAGNOSIS — Z0000 Encounter for general adult medical examination without abnormal findings: Secondary | ICD-10-CM

## 2014-01-12 DIAGNOSIS — Z01419 Encounter for gynecological examination (general) (routine) without abnormal findings: Secondary | ICD-10-CM

## 2014-01-12 LAB — POCT URINALYSIS DIPSTICK
BILIRUBIN UA: NEGATIVE
Blood, UA: NEGATIVE
GLUCOSE UA: NEGATIVE
KETONES UA: NEGATIVE
LEUKOCYTES UA: NEGATIVE
Nitrite, UA: NEGATIVE
Protein, UA: NEGATIVE
Urobilinogen, UA: NEGATIVE
pH, UA: 7

## 2014-01-12 MED ORDER — VALACYCLOVIR HCL 500 MG PO TABS: 500.0000 mg | ORAL_TABLET | ORAL | Status: DC | PRN

## 2014-01-12 NOTE — Patient Instructions (Signed)

## 2014-01-12 NOTE — Progress Notes (Signed)
43 y.o. [id number] MarriedCaucasianF here for annual exam.  Husband doing well.  Was diagnosed a year ago with diabetes.  This is diet controlled.    Pt doing well.  Had abnormal MMG 1 year ago.    Planning 50th anniversary for her parents.  Patient's last menstrual period was 08/18/2011.          Sexually active: yes  The current method of family planning is status post hysterectomy.    Exercising: yes  Walking Smoker:  no  Health Maintenance: Pap:  01/2011 Neg History of abnormal Pap:  yes MMG:  12/2012 BI-RADS 3: Probably Benign  Colonoscopy:  2008 BMD:   Never TDaP:  2008 Screening Labs: PCP, Hb today: 13.1, Urine today: Clear   reports that she has never smoked. She has never used smokeless tobacco. She reports that she drinks about .5 ounces of alcohol per week. She reports that she does not use illicit drugs.  Past Medical History  Diagnosis Date  . GERD (gastroesophageal reflux disease)   . Headache(784.0)   . Hiatal hernia   . HSV (herpes simplex virus) infection   . Abnormal Pap smear     Past Surgical History  Procedure Laterality Date  . Colonoscopy    . Leep  2005  . Cystoscopy  09/02/2011    Procedure: CYSTOSCOPY;  Surgeon: Felipa Emory;  Location: [facility];  Service: Gynecology;  Laterality: N/A;  . Laparoscopic assisted vaginal hysterectomy  09/02/2011    Robotic/MSM   . Cryotherapy  1993    cervix  . Abdominal hysterectomy      Current Outpatient Prescriptions  Medication Sig Dispense Refill  . valACYclovir (VALTREX) 500 MG tablet Take 1 tablet (500 mg total) by mouth as needed.  90 tablet  3  . Cyanocobalamin (VITAMIN B 12 PO) Take by mouth.      . DHA-EPA-Vitamin E (OMEGA-3 COMPLEX PO) Take by mouth daily.      Marland Kitchen FOLIC ACID PO Take by mouth.      . Ibuprofen (ADVIL) 200 MG CAPS Take by mouth.      Marland Kitchen omeprazole (PRILOSEC) 20 MG capsule Take 20 mg by mouth daily as needed. For acid reflux      . VITAMIN D, CHOLECALCIFEROL, PO Take by mouth.        No current facility-administered medications for this visit.    Family History  Problem Relation Age of Onset  . Colon cancer Father 62  . Breast cancer Maternal Aunt 34  . Breast cancer Maternal Aunt 45    ROS:  Pertinent items are noted in HPI.  Otherwise, a comprehensive ROS was negative.  Exam:   BP 122/78  Pulse 76  Resp 18  Ht 5' 0.75" (1.543 m)  Wt 143 lb (64.864 kg)  BMI 27.24 kg/m2  LMP 08/18/2011    Height: 5' 0.75" (154.3 cm)  Ht Readings from Last 3 Encounters:  01/12/14 5' 0.75" (1.543 m)  12/01/12 5\' 1"  (1.549 m)  05/20/12 5' (1.524 m)    General appearance: alert, cooperative and appears stated age Head: Normocephalic, without obvious abnormality, atraumatic Neck: no adenopathy, supple, symmetrical, trachea midline and thyroid normal to inspection and palpation Lungs: clear to auscultation bilaterally Breasts: normal appearance, no masses or tenderness Heart: regular rate and rhythm Abdomen: soft, non-tender; bowel sounds normal; no masses,  no organomegaly Extremities: extremities normal, atraumatic, no cyanosis or edema Skin: Skin color, texture, turgor normal. No rashes or lesions Lymph nodes: Cervical, supraclavicular, and axillary  nodes normal. No abnormal inguinal nodes palpated Neurologic: Grossly normal   Pelvic: External genitalia:  no lesions              Urethra:  normal appearing urethra with no masses, tenderness or lesions              Bartholins and Skenes: normal                 Vagina: normal appearing vagina with normal color and discharge, no lesions              Cervix: absent              Pap taken: no Bimanual Exam:  Uterus:  uterus absent              Adnexa: normal adnexa and no mass, fullness, tenderness               Rectovaginal: Confirms               Anus:  normal sphincter tone, no lesions  A:  Well Woman with normal exam Grade 4 breast density Family hx of colon cancer in her father in late 3th Long hx of  abnormal Pap smears.  Pathology with hysterectomy was negative.  P:   Mammogram scheduled today for pt.  Overdue.  Had abnormal at [facility] one year ago.  Did not go for follow-up.   pap smear not indicated. Valtrex 500mg  daily #90/4RF return annually or prn  An After Visit Summary was printed and given to the patient.

## 2014-01-13 ENCOUNTER — Telehealth: Payer: Self-pay | Admitting: Obstetrics & Gynecology

## 2014-01-13 LAB — HEMOGLOBIN, FINGERSTICK: HEMOGLOBIN, FINGERSTICK: 13.1 g/dL (ref 12.0–16.0)

## 2014-01-13 NOTE — Telephone Encounter (Signed)
Spoke with patient, she states she was not fasting at time of blood draw but would like to know "about where I am at" with cholesterol. I advised that fasting levels of cholesterol are most accurate.  She would also like other blood work, she states Dr. Sabra Heck usually checks vitamin D and requests any blood work that Dr. Sabra Heck suggests.   Dr. Sabra Heck, do you want patient to come back for fasting cholesterol and other labs or order non fasting cholesterol testing?

## 2014-01-13 NOTE — Telephone Encounter (Signed)
Let her know will add labs.  CMP, Lipids, Vit D, TSH.  If abnormal, will have pt return.  CC: Caryl Pina Stem to add labs.

## 2014-01-13 NOTE — Telephone Encounter (Signed)
Pt forgot to mention that she wanted cholesterol checked at visit on yesterday. Wondering if its too late to add to order from yesterday.

## 2014-01-16 ENCOUNTER — Other Ambulatory Visit: Payer: Self-pay | Admitting: Obstetrics & Gynecology

## 2014-01-16 DIAGNOSIS — Z0000 Encounter for general adult medical examination without abnormal findings: Secondary | ICD-10-CM

## 2014-01-16 LAB — TSH: TSH: 1.818 u[IU]/mL (ref 0.350–4.500)

## 2014-01-16 LAB — COMPREHENSIVE METABOLIC PANEL
ALK PHOS: 83 U/L (ref 39–117)
ALT: 24 U/L (ref 0–35)
AST: 15 U/L (ref 0–37)
Albumin: 4.7 g/dL (ref 3.5–5.2)
BILIRUBIN TOTAL: 0.4 mg/dL (ref 0.2–1.2)
BUN: 18 mg/dL (ref 6–23)
CO2: 25 mEq/L (ref 19–32)
CREATININE: 0.63 mg/dL (ref 0.50–1.10)
Calcium: 9.8 mg/dL (ref 8.4–10.5)
Chloride: 101 mEq/L (ref 96–112)
Glucose, Bld: 69 mg/dL — ABNORMAL LOW (ref 70–99)
Potassium: 4.5 mEq/L (ref 3.5–5.3)
Sodium: 136 mEq/L (ref 135–145)
Total Protein: 7.1 g/dL (ref 6.0–8.3)

## 2014-01-16 LAB — LIPID PANEL
CHOL/HDL RATIO: 2.8 ratio
Cholesterol: 203 mg/dL — ABNORMAL HIGH (ref 0–200)
HDL: 73 mg/dL (ref >39)
LDL CALC: 99 mg/dL (ref 0–99)
Triglycerides: 156 mg/dL — ABNORMAL HIGH (ref <150)
VLDL: 31 mg/dL (ref 0–40)

## 2014-01-16 NOTE — Telephone Encounter (Signed)
Spoke with patient. Message from [place] given as seen below. Advised as soon as results are in from blood work we will give patient a call. Patient agreeable and verbalizes understanding.  Routing to provider for final review. Patient agreeable to disposition. Will close encounter

## 2014-01-16 NOTE — Addendum Note (Signed)
Addended by: Blanchard Kelch R on: 01/16/2014 10:37 AM   Modules accepted: Orders

## 2014-01-17 LAB — VITAMIN D 25 HYDROXY (VIT D DEFICIENCY, FRACTURES): Vit D, 25-Hydroxy: 35 ng/mL (ref 30–89)

## 2014-02-07 ENCOUNTER — Telehealth: Payer: Self-pay | Admitting: Obstetrics & Gynecology

## 2014-02-07 NOTE — Telephone Encounter (Signed)
Patient is calling for recent lab results °

## 2014-02-07 NOTE — Telephone Encounter (Signed)
Spoke with patient. Patient states that she is unable to get into her mychart and has not heard about results. Advised of results as seen below. Patient agreeable and verbalizes understanding.    Notes Recorded by Lyman Speller, MD on 01/17/2014 at 11:06 AM Informed of results via [place].  Carmen Robles, Your labs are fine. Your metabolic panel is good. Your glucose is "low" but that isn't a problem as it isn't too low and is only one point below the "normal" range. Your Vit D is 35. Goal is above 40. You can take 800IU Vit d or a multivitamin with some Vit D in it. Your cholesterol is fine too. Your total is 203 and this is a touch above normal but your LDLs are find and your HDLs (the good cholesterol) is great. You do NOT need treatment. Your triglycerides are a little up but I don't think you were fasting so that isn't a problem.     Routing to provider for final review. Patient agreeable to disposition. Will close encounter

## 2014-07-17 ENCOUNTER — Encounter: Payer: Self-pay | Admitting: Obstetrics & Gynecology

## 2015-01-19 ENCOUNTER — Encounter: Payer: Self-pay | Admitting: Obstetrics & Gynecology

## 2015-01-19 ENCOUNTER — Ambulatory Visit (INDEPENDENT_AMBULATORY_CARE_PROVIDER_SITE_OTHER): Payer: BLUE CROSS/BLUE SHIELD | Admitting: Obstetrics & Gynecology

## 2015-01-19 VITALS — BP 110/66 | HR 80 | Resp 16 | Ht 60.5 in | Wt 150.0 lb

## 2015-01-19 DIAGNOSIS — Z0000 Encounter for general adult medical examination without abnormal findings: Secondary | ICD-10-CM

## 2015-01-19 DIAGNOSIS — Z01419 Encounter for gynecological examination (general) (routine) without abnormal findings: Secondary | ICD-10-CM | POA: Diagnosis not present

## 2015-01-19 LAB — POCT URINALYSIS DIPSTICK
Bilirubin, UA: NEGATIVE
Glucose, UA: NEGATIVE
KETONES UA: NEGATIVE
Leukocytes, UA: NEGATIVE
Nitrite, UA: NEGATIVE
Protein, UA: NEGATIVE
RBC UA: NEGATIVE
Urobilinogen, UA: NEGATIVE
pH, UA: 5

## 2015-01-19 LAB — LIPID PANEL
Cholesterol: 214 mg/dL — ABNORMAL HIGH (ref 0–200)
HDL: 69 mg/dL (ref >=46)
LDL Cholesterol: 125 mg/dL — ABNORMAL HIGH (ref 0–99)
Total CHOL/HDL Ratio: 3.1 Ratio
Triglycerides: 98 mg/dL (ref <150)
VLDL: 20 mg/dL (ref 0–40)

## 2015-01-19 LAB — COMPREHENSIVE METABOLIC PANEL
ALBUMIN: 4.6 g/dL (ref 3.5–5.2)
ALK PHOS: 71 U/L (ref 39–117)
ALT: 23 U/L (ref 0–35)
AST: 16 U/L (ref 0–37)
BUN: 14 mg/dL (ref 6–23)
CALCIUM: 9.4 mg/dL (ref 8.4–10.5)
CHLORIDE: 98 meq/L (ref 96–112)
CO2: 27 mEq/L (ref 19–32)
Creat: 0.62 mg/dL (ref 0.50–1.10)
Glucose, Bld: 86 mg/dL (ref 70–99)
POTASSIUM: 4 meq/L (ref 3.5–5.3)
SODIUM: 139 meq/L (ref 135–145)
TOTAL PROTEIN: 7.4 g/dL (ref 6.0–8.3)
Total Bilirubin: 0.5 mg/dL (ref 0.2–1.2)

## 2015-01-19 LAB — TSH: TSH: 1.812 u[IU]/mL (ref 0.350–4.500)

## 2015-01-19 MED ORDER — VALACYCLOVIR HCL 500 MG PO TABS: 500.0000 mg | ORAL_TABLET | ORAL | Status: DC | PRN

## 2015-01-19 NOTE — Progress Notes (Signed)
44 y.o. [id number] MarriedCaucasianF here for annual exam.  No vaginal bleeding.   Patient's last menstrual period was 08/18/2011.          Sexually active: Yes.    The current method of family planning is status post hysterectomy.   Exercising: Yes.    Walking Smoker:  no  Health Maintenance: Pap: 01/2011 Neg History of abnormal Pap:  yes MMG: 02/08/14 BIRADS2:Benign  Self Breast Exam: yes Colonoscopy:  2008 Normal - Repeat at age 49   BMD:   Never TDaP:  2008 Screening Labs: Here today , Hb today: , Urine today:    reports that she has never smoked. She has never used smokeless tobacco. She reports that she drinks about 0.5 oz of alcohol per week. She reports that she does not use illicit drugs.  Past Medical History  Diagnosis Date  . GERD (gastroesophageal reflux disease)   . Headache(784.0)   . Hiatal hernia   . HSV (herpes simplex virus) infection   . Abnormal Pap smear     Past Surgical History  Procedure Laterality Date  . Leep  2005  . Cystoscopy  09/02/2011    Procedure: CYSTOSCOPY;  Surgeon: Felipa Emory;  Location: [facility];  Service: Gynecology;  Laterality: N/A;  . Laparoscopic assisted vaginal hysterectomy  09/02/2011    Robotic/MSM   . Cryotherapy  1993    cervix    Current Outpatient Prescriptions  Medication Sig Dispense Refill  . FOLIC ACID PO Take by mouth.    . Ibuprofen (ADVIL) 200 MG CAPS Take by mouth.    Marland Kitchen omeprazole (PRILOSEC) 40 MG capsule Take 40 mg by mouth daily.  11  . valACYclovir (VALTREX) 500 MG tablet Take 1 tablet (500 mg total) by mouth as needed. 90 tablet 3   No current facility-administered medications for this visit.    Family History  Problem Relation Age of Onset  . Colon cancer Father 85  . Diabetes Father   . Breast cancer Maternal Aunt 83  . Breast cancer Maternal Aunt 45    ROS:  Pertinent items are noted in HPI.  Otherwise, a comprehensive ROS was negative.  Exam:   BP 110/66 mmHg  Pulse 80  Resp 16  Ht 5'  0.5" (1.537 m)  Wt 150 lb (68.04 kg)  BMI 28.80 kg/m2  LMP 08/18/2011  Weight change: +7#  Height: 5' 0.5" (153.7 cm)  Ht Readings from Last 3 Encounters:  01/19/15 5' 0.5" (1.537 m)  01/12/14 5' 0.75" (1.543 m)  12/01/12 5\' 1"  (1.549 m)    General appearance: alert, cooperative and appears stated age Head: Normocephalic, without obvious abnormality, atraumatic Neck: no adenopathy, supple, symmetrical, trachea midline and thyroid normal to inspection and palpation Lungs: clear to auscultation bilaterally Breasts: normal appearance, no masses or tenderness Heart: regular rate and rhythm Abdomen: soft, non-tender; bowel sounds normal; no masses,  no organomegaly Extremities: extremities normal, atraumatic, no cyanosis or edema Skin: Skin color, texture, turgor normal. No rashes or lesions Lymph nodes: Cervical, supraclavicular, and axillary nodes normal. No abnormal inguinal nodes palpated Neurologic: Grossly normal   Pelvic: External genitalia:  no lesions              Urethra:  normal appearing urethra with no masses, tenderness or lesions              Bartholins and Skenes: normal                 Vagina: normal appearing vagina  with normal color and discharge, no lesions              Cervix: absent              Pap taken: No. Bimanual Exam:  Uterus:  uterus absent              Adnexa: normal adnexa and no mass, fullness, tenderness               Rectovaginal: Confirms               Anus:  normal sphincter tone, no lesions  Chaperone was present for exam.  A:  Well Woman with normal exam Grade 4 breast density Family hx of colon cancer in her father in late 16th Long hx of abnormal Pap smears. Pathology with hysterectomy was negative.  P: Mammogram guidelines reviewed. pap smear not indicated. Valtrex 500mg  daily #90/4RF Labs obtained today--cmp, tsh, vit d, and lipids return annually or prn

## 2015-01-20 LAB — VITAMIN D 25 HYDROXY (VIT D DEFICIENCY, FRACTURES): Vit D, 25-Hydroxy: 25 ng/mL — ABNORMAL LOW (ref 30–100)

## 2015-01-23 LAB — HEMOGLOBIN, FINGERSTICK: HEMOGLOBIN, FINGERSTICK: 12.8 g/dL (ref 12.0–16.0)

## 2016-01-28 ENCOUNTER — Telehealth: Payer: Self-pay | Admitting: Obstetrics and Gynecology

## 2016-01-28 ENCOUNTER — Ambulatory Visit: Payer: BLUE CROSS/BLUE SHIELD | Admitting: Obstetrics and Gynecology

## 2016-01-28 NOTE — Telephone Encounter (Signed)
Patient canceled her aex appointment today. Separate message sent to Dr.Silva.

## 2017-01-26 ENCOUNTER — Telehealth: Payer: Self-pay | Admitting: Obstetrics & Gynecology

## 2017-01-26 NOTE — Progress Notes (Signed)
46 y.o. [id number] MarriedCaucasianF here for annual exam.  Doing well.  Denies vaginal bleeding.  Saw Dr. Renda Rolls this morning.  Has some freezing done.    Reports she's noticed some increased vaginal odor.  Denies discharge.    Patient's last menstrual period was 08/18/2011.          Sexually active: Yes.    The current method of family planning is status post hysterectomy.    Exercising: No.  The patient does not participate in regular exercise at present. Smoker:  no  Health Maintenance: Pap:  01/2011 Negative  History of abnormal Pap:  yes MMG:  02/08/14 Diagnostic Right BIRADS2:benign  Colonoscopy:  2008. Normal. Declines doing this year.  Father was diagnosed at age 22 with colon cancer.   BMD:   Never TDaP:  2008. Wants to do today  Hep C testing: N/A Screening Labs: Here, Hb today: 12.0, Urine today: not collected   reports that she has never smoked. She has never used smokeless tobacco. She reports that she drinks about 0.5 oz of alcohol per week . She reports that she does not use drugs.  Past Medical History:  Diagnosis Date  . Abnormal Pap smear   . GERD (gastroesophageal reflux disease)   . Headache(784.0)   . Hiatal hernia   . HSV (herpes simplex virus) infection     Past Surgical History:  Procedure Laterality Date  . CRYOTHERAPY  1993   cervix  . CYSTOSCOPY  09/02/2011   Procedure: CYSTOSCOPY;  Surgeon: Felipa Emory;  Location: [facility];  Service: Gynecology;  Laterality: N/A;  . LAPAROSCOPIC ASSISTED VAGINAL HYSTERECTOMY  09/02/2011   Robotic/MSM   . LEEP  2005    Current Outpatient Prescriptions  Medication Sig Dispense Refill  . FOLIC ACID PO Take by mouth.    . Ibuprofen (ADVIL) 200 MG CAPS Take by mouth.    Marland Kitchen omeprazole (PRILOSEC) 40 MG capsule Take 40 mg by mouth daily.  11  . valACYclovir (VALTREX) 500 MG tablet Take 1 tablet (500 mg total) by mouth as needed. 90 tablet 4   No current facility-administered medications for this visit.      Family History  Problem Relation Age of Onset  . Colon cancer Father 39  . Diabetes Father   . Breast cancer Maternal Aunt 37  . Breast cancer Maternal Aunt 45    ROS:  Pertinent items are noted in HPI.  Otherwise, a comprehensive ROS was negative.  Exam:   BP 118/60 (BP Location: Right Arm, Patient Position: Sitting, Cuff Size: Normal)   Pulse 92   Resp 18   Ht 5' 0.5" (1.537 m)   Wt 156 lb (70.8 kg)   LMP 08/18/2011   BMI 29.97 kg/m   Weight change: +13#   Height: 5' 0.5" (153.7 cm)  Ht Readings from Last 3 Encounters:  01/27/17 5' 0.5" (1.537 m)  01/19/15 5' 0.5" (1.537 m)  01/12/14 5' 0.75" (1.543 m)   General appearance: alert, cooperative and appears stated age Head: Normocephalic, without obvious abnormality, atraumatic Neck: no adenopathy, supple, symmetrical, trachea midline and thyroid normal to inspection and palpation Lungs: clear to auscultation bilaterally Breasts: normal appearance, no masses or tenderness Heart: regular rate and rhythm Abdomen: soft, non-tender; bowel sounds normal; no masses,  no organomegaly Extremities: extremities normal, atraumatic, no cyanosis or edema Skin: Skin color, texture, turgor normal. No rashes or lesions Lymph nodes: Cervical, supraclavicular, and axillary nodes normal. No abnormal inguinal nodes palpated Neurologic: Grossly normal  Pelvic: External genitalia:  no lesions              Urethra:  normal appearing urethra with no masses, tenderness or lesions              Bartholins and Skenes: normal                 Vagina: normal appearing vagina with normal color and discharge, no lesions              Cervix: absent              Pap taken: No Bimanual Exam:  Uterus:  uterus absent              Adnexa: no mass, fullness, tenderness               Rectovaginal: Confirms               Anus:  normal sphincter tone, no lesions  Chaperone was present for exam.  A:  Well Woman with normal exam Grade 4 breast  density Family hx of colon cancer in her father, diagnosed aged 4 Long hx of abnormal pap smears.  Cervical pathology with hysterectomy was negative. Vaginal odor  P:   Mammogram guidelines reviewed.  She KNOWS she is due.  Highly encouraged her to have this done.  States she will call Declines colonoscopy this year.  I highly recommend she not wait past age 38 (ten years younger than her father was diagnosed) for repeat colonoscopy. pap smear obtained today Tdap updated CBC, CMP, TSH, Vit D, and lipids obtained today RF for Prilosec 40mg  daily and Valtrex 500mg  daily.  #90/4RF Affirm pending return annually or prn

## 2017-01-26 NOTE — Telephone Encounter (Signed)
LMTCB about canceled appointment.

## 2017-01-27 ENCOUNTER — Other Ambulatory Visit (HOSPITAL_COMMUNITY)
Admission: RE | Admit: 2017-01-27 | Discharge: 2017-01-27 | Disposition: A | Discharge status: Home / Self Care | Payer: 59 | Source: Ambulatory Visit | Attending: Obstetrics & Gynecology | Admitting: Obstetrics & Gynecology

## 2017-01-27 ENCOUNTER — Ambulatory Visit (INDEPENDENT_AMBULATORY_CARE_PROVIDER_SITE_OTHER): Payer: 59 | Admitting: Obstetrics & Gynecology

## 2017-01-27 ENCOUNTER — Encounter: Payer: Self-pay | Admitting: Obstetrics & Gynecology

## 2017-01-27 VITALS — BP 118/60 | HR 92 | Resp 18 | Ht 60.5 in | Wt 156.0 lb

## 2017-01-27 DIAGNOSIS — Z124 Encounter for screening for malignant neoplasm of cervix: Secondary | ICD-10-CM | POA: Diagnosis present

## 2017-01-27 DIAGNOSIS — Z0000 Encounter for general adult medical examination without abnormal findings: Secondary | ICD-10-CM | POA: Diagnosis not present

## 2017-01-27 DIAGNOSIS — N898 Other specified noninflammatory disorders of vagina: Secondary | ICD-10-CM

## 2017-01-27 DIAGNOSIS — Z23 Encounter for immunization: Secondary | ICD-10-CM

## 2017-01-27 DIAGNOSIS — Z01419 Encounter for gynecological examination (general) (routine) without abnormal findings: Secondary | ICD-10-CM

## 2017-01-27 DIAGNOSIS — N951 Menopausal and female climacteric states: Secondary | ICD-10-CM

## 2017-01-27 LAB — LIPID PANEL
CHOLESTEROL: 194 mg/dL (ref <200)
HDL: 71 mg/dL (ref >50)
LDL Cholesterol: 92 mg/dL (ref <100)
TRIGLYCERIDES: 154 mg/dL — AB (ref <150)
Total CHOL/HDL Ratio: 2.7 Ratio (ref <5.0)
VLDL: 31 mg/dL — ABNORMAL HIGH (ref <30)

## 2017-01-27 LAB — COMPREHENSIVE METABOLIC PANEL
ALT: 17 U/L (ref 6–29)
AST: 13 U/L (ref 10–35)
Albumin: 4.2 g/dL (ref 3.6–5.1)
Alkaline Phosphatase: 87 U/L (ref 33–115)
BUN: 13 mg/dL (ref 7–25)
CALCIUM: 9.2 mg/dL (ref 8.6–10.2)
CO2: 28 mmol/L (ref 20–31)
Chloride: 103 mmol/L (ref 98–110)
Creat: 0.72 mg/dL (ref 0.50–1.10)
Glucose, Bld: 92 mg/dL (ref 65–99)
POTASSIUM: 3.8 mmol/L (ref 3.5–5.3)
Sodium: 141 mmol/L (ref 135–146)
TOTAL PROTEIN: 6.9 g/dL (ref 6.1–8.1)
Total Bilirubin: 0.3 mg/dL (ref 0.2–1.2)

## 2017-01-27 LAB — CBC
HCT: 36.9 % (ref 35.0–45.0)
Hemoglobin: 12.2 g/dL (ref 11.7–15.5)
MCH: 28.6 pg (ref 27.0–33.0)
MCHC: 33.1 g/dL (ref 32.0–36.0)
MCV: 86.6 fL (ref 80.0–100.0)
MPV: 9.7 fL (ref 7.5–12.5)
Platelets: 323 10*3/uL (ref 140–400)
RBC: 4.26 MIL/uL (ref 3.80–5.10)
RDW: 13.7 % (ref 11.0–15.0)
WBC: 9.9 10*3/uL (ref 3.8–10.8)

## 2017-01-27 LAB — TSH: TSH: 2.23 mIU/L

## 2017-01-27 LAB — HEMOGLOBIN, FINGERSTICK: Hemoglobin, fingerstick: 12 g/dL (ref 12.0–15.0)

## 2017-01-27 MED ORDER — VALACYCLOVIR HCL 500 MG PO TABS: 500.0000 mg | ORAL_TABLET | Freq: Every day | ORAL | 4 refills | Status: DC

## 2017-01-27 MED ORDER — VALACYCLOVIR HCL 500 MG PO TABS: 500.0000 mg | ORAL_TABLET | Freq: Every day | ORAL | 12 refills | Status: DC

## 2017-01-27 MED ORDER — OMEPRAZOLE 40 MG PO CPDR: 40.0000 mg | DELAYED_RELEASE_CAPSULE | Freq: Every day | ORAL | 11 refills | Status: DC

## 2017-01-28 ENCOUNTER — Telehealth: Payer: Self-pay | Admitting: Obstetrics & Gynecology

## 2017-01-28 LAB — WET PREP BY MOLECULAR PROBE
Candida species: NOT DETECTED
Gardnerella vaginalis: NOT DETECTED
Trichomonas vaginosis: NOT DETECTED

## 2017-01-28 LAB — VITAMIN D 25 HYDROXY (VIT D DEFICIENCY, FRACTURES): Vit D, 25-Hydroxy: 22 ng/mL — ABNORMAL LOW (ref 30–100)

## 2017-01-28 LAB — FOLLICLE STIMULATING HORMONE: FSH: 11.1 m[IU]/mL

## 2017-01-28 NOTE — Telephone Encounter (Signed)
Erroneous encounter

## 2017-01-29 LAB — CYTOLOGY - PAP
DIAGNOSIS: NEGATIVE
HPV (WINDOPATH): NOT DETECTED

## 2017-01-30 ENCOUNTER — Ambulatory Visit: Payer: BLUE CROSS/BLUE SHIELD | Admitting: Obstetrics & Gynecology

## 2017-02-03 ENCOUNTER — Telehealth: Payer: Self-pay

## 2017-02-03 NOTE — Telephone Encounter (Signed)
Dr.Miller, please review and advise lab results from 01/27/2017.

## 2017-02-04 MED ORDER — VITAMIN D (ERGOCALCIFEROL) 1.25 MG (50000 UNIT) PO CAPS: 50000.0000 [IU] | ORAL_CAPSULE | ORAL | 0 refills | Status: DC

## 2017-02-04 NOTE — Telephone Encounter (Signed)
Spoke with patient, advised as seen below per Dr. Sabra Heck. Patient scheduled for lab appointment on 05/11/17 at 8:30am. RX for Vit D to verfied pharmacy on file. Next AEX 04/20/18. Patient verbalizes understanding and is agreeable.  Routing to provider for final review. Patient is agreeable to disposition. Will close encounter.

## 2017-02-04 NOTE — Telephone Encounter (Signed)
-----   Message from Megan Salon, MD sent at 02/04/2017 11:25 AM EDT ----- Please let pt know her [facility] does not show menopause at this point.  Vit D is low at 22.  She needs to take 50K weekly and recheck 12 weeks.  Ok to place order and send in rx.  CMP normal.  Lipids normal with mildly elevated triglycerides. This is fine to watch.  No treatment needed.  TSH normal.  CBC normal.    Affirm negative.  If feeling symptomatic, ok to douche x 1 with hydrogen peroxide (over the counter) mixed 1:1 with tap water.    Pap neg and HR HPV neg.  02 recall.

## 2017-02-04 NOTE — Telephone Encounter (Signed)
Results sent to you to communicate with pt.  I thought a part of her pap was still pending.  Sorry to take so long.  Thanks.

## 2017-02-04 NOTE — Telephone Encounter (Signed)
Left message to call Taressa Rauh at [phone].  

## 2017-05-07 ENCOUNTER — Telehealth: Payer: Self-pay | Admitting: Obstetrics & Gynecology

## 2017-05-07 NOTE — Telephone Encounter (Signed)
Patient canceled her Vitamin D appointment 05/11/17. Patient said she would call later to reschedule.

## 2017-05-11 ENCOUNTER — Other Ambulatory Visit: Payer: Self-pay

## 2017-06-22 ENCOUNTER — Encounter: Payer: Self-pay | Admitting: Internal Medicine

## 2017-08-05 ENCOUNTER — Ambulatory Visit: Payer: 59 | Admitting: Internal Medicine

## 2018-01-26 ENCOUNTER — Telehealth: Payer: Self-pay | Admitting: Obstetrics & Gynecology

## 2018-01-26 NOTE — Telephone Encounter (Signed)
Spoke with patient and advised we will have Dr.Miller sign order for MMG and Possible U/S if needed and fax to [facility] for scheduled appointment 02-15-18. Routed to [place]. (completed [employer] form in folder outside of office)

## 2018-01-26 NOTE — Telephone Encounter (Signed)
Patient is having mammogram done at [facility] 02/15/18. Needs order for 3D Screening with ultrasound if needed faxed to them.

## 2018-02-01 ENCOUNTER — Other Ambulatory Visit: Payer: Self-pay | Admitting: *Deleted

## 2018-02-01 MED ORDER — VALACYCLOVIR HCL 500 MG PO TABS: 500.0000 mg | ORAL_TABLET | Freq: Every day | ORAL | 3 refills | Status: DC

## 2018-02-01 MED ORDER — OMEPRAZOLE 40 MG PO CPDR: 40.0000 mg | DELAYED_RELEASE_CAPSULE | Freq: Every day | ORAL | 3 refills | Status: DC

## 2018-02-01 NOTE — Telephone Encounter (Signed)
Fax request received from [facility] in [place] sent for refills on the following:   Medication refill request: Valtrex Last AEX:  01/27/17 SM  Next AEX: 04/20/18  Last MMG (if hormonal medication request): 02/08/14 Diagnostic Right BIRADS2:benign  Refill authorized: 01/27/17 #30, 12 RF. Today, please advise.   Medication refill request: Omeprazole  Refill authorized: 01/27/17 #30, 11 RF. Today, please advise.

## 2018-02-23 ENCOUNTER — Encounter: Payer: Self-pay | Admitting: Obstetrics & Gynecology

## 2018-02-23 ENCOUNTER — Other Ambulatory Visit: Payer: Self-pay | Admitting: Obstetrics & Gynecology

## 2018-02-24 ENCOUNTER — Other Ambulatory Visit: Payer: Self-pay | Admitting: Obstetrics & Gynecology

## 2018-02-24 MED ORDER — OMEPRAZOLE 40 MG PO CPDR: DELAYED_RELEASE_CAPSULE | ORAL | 0 refills | Status: DC

## 2018-02-24 NOTE — Addendum Note (Signed)
Addended by: Susanne Greenhouse E on: 02/24/2018 11:17 AM   Modules accepted: Orders

## 2018-04-13 ENCOUNTER — Ambulatory Visit (INDEPENDENT_AMBULATORY_CARE_PROVIDER_SITE_OTHER): Payer: 59 | Admitting: Obstetrics & Gynecology

## 2018-04-13 ENCOUNTER — Encounter: Payer: Self-pay | Admitting: Obstetrics & Gynecology

## 2018-04-13 VITALS — BP 100/64 | HR 76 | Resp 18 | Ht 60.75 in | Wt 155.4 lb

## 2018-04-13 DIAGNOSIS — Z0000 Encounter for general adult medical examination without abnormal findings: Secondary | ICD-10-CM | POA: Diagnosis not present

## 2018-04-13 DIAGNOSIS — Z01419 Encounter for gynecological examination (general) (routine) without abnormal findings: Secondary | ICD-10-CM

## 2018-04-13 DIAGNOSIS — Z1211 Encounter for screening for malignant neoplasm of colon: Secondary | ICD-10-CM | POA: Diagnosis not present

## 2018-04-13 LAB — POCT URINALYSIS DIPSTICK
Bilirubin, UA: NEGATIVE
Blood, UA: NEGATIVE
GLUCOSE UA: NEGATIVE
KETONES UA: NEGATIVE
Leukocytes, UA: NEGATIVE
NITRITE UA: NEGATIVE
Protein, UA: NEGATIVE
Urobilinogen, UA: 0.2 E.U./dL
pH, UA: 7 (ref 5.0–8.0)

## 2018-04-13 MED ORDER — VALACYCLOVIR HCL 500 MG PO TABS: 500.0000 mg | ORAL_TABLET | Freq: Every day | ORAL | 4 refills | Status: DC
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
## 2018-04-20 ENCOUNTER — Ambulatory Visit: Payer: 59 | Admitting: Obstetrics & Gynecology

## 2018-04-20 ENCOUNTER — Encounter

## 2018-04-20 LAB — SPECIMEN STATUS REPORT

## 2018-04-20 LAB — FECAL OCCULT BLOOD, IMMUNOCHEMICAL: Fecal Occult Bld: NEGATIVE

## 2019-05-09 ENCOUNTER — Encounter: Payer: Self-pay | Admitting: Obstetrics & Gynecology

## 2019-05-09 DIAGNOSIS — Z1231 Encounter for screening mammogram for malignant neoplasm of breast: Secondary | ICD-10-CM | POA: Diagnosis not present

## 2019-06-01 ENCOUNTER — Other Ambulatory Visit: Payer: Self-pay | Admitting: Obstetrics & Gynecology

## 2019-06-01 MED ORDER — VALACYCLOVIR HCL 500 MG PO TABS: 500.0000 mg | ORAL_TABLET | Freq: Every day | ORAL | 0 refills | Status: DC

## 2019-06-01 NOTE — Telephone Encounter (Signed)
Bailey from [facility] is calling to request a refill for Valtrex 500 MG.

## 2019-06-01 NOTE — Telephone Encounter (Signed)
Medication refill request: Valtrex Last AEX:  04/13/2018 SM Next AEX: 08/05/2019 Last MMG (if hormonal medication request): 05/09/2019 BIRADS 1 Negative Density C Refill authorized: Pending #90 with no refills. Previous prescription has no refills left and has also expired. Pt would like to change pharmacy to [facility]. She is on a call list in case there is an appt here sooner. Please advise.

## 2019-06-29 ENCOUNTER — Other Ambulatory Visit: Payer: Self-pay

## 2019-06-30 ENCOUNTER — Ambulatory Visit: Payer: 59 | Admitting: Obstetrics & Gynecology

## 2019-08-03 ENCOUNTER — Other Ambulatory Visit: Payer: Self-pay

## 2019-08-05 ENCOUNTER — Ambulatory Visit: Payer: BC Managed Care – PPO | Admitting: Obstetrics & Gynecology

## 2019-08-05 ENCOUNTER — Ambulatory Visit: Payer: 59 | Admitting: Obstetrics & Gynecology

## 2019-08-05 ENCOUNTER — Encounter

## 2019-09-12 NOTE — Progress Notes (Signed)
48 y.o. [id number] Married White or Caucasian female here for annual exam.  She is doing well and her family is well.  Denies vaginal bleeding.  She's lost about 10 pounds doing some healthy life changes with her husband.     Brother had shingles over the holiday.  Would like to have this when she turns 51.    Patient's last menstrual period was 08/18/2011.          Sexually active: Yes.    The current method of family planning is status post hysterectomy.    Exercising: No.  exercise Smoker:  no  Health Maintenance: Pap:  01-27-17 neg HPV HR neg History of abnormal Pap:  Yes LEEP 2005 MMG:  05-09-2019 category c density birads 1:neg Colonoscopy: 2008 neg BMD:   none TDaP:  2018 Pneumonia vaccine(s): no Shingrix:   no Hep C testing: not done Screening Labs: here today   reports that she has never smoked. She has never used smokeless tobacco. She reports current alcohol use. She reports that she does not use drugs.  Past Medical History:  Diagnosis Date  . Abnormal Pap smear   . GERD (gastroesophageal reflux disease)   . Headache(784.0)   . Hiatal hernia   . HSV (herpes simplex virus) infection     Past Surgical History:  Procedure Laterality Date  . CRYOTHERAPY  1993   cervix  . CYSTOSCOPY  09/02/2011   Procedure: CYSTOSCOPY;  Surgeon: Felipa Emory;  Location: [facility];  Service: Gynecology;  Laterality: N/A;  . LAPAROSCOPIC ASSISTED VAGINAL HYSTERECTOMY  09/02/2011   Robotic/MSM   . LEEP  2005    Current Outpatient Medications  Medication Sig Dispense Refill  . Ibuprofen (ADVIL) 200 MG CAPS Take by mouth as needed.     Marland Kitchen omeprazole (PRILOSEC) 40 MG capsule TAKE 1 CAPSULE BY MOUTH EVERY DAY 90 capsule 0  . valACYclovir (VALTREX) 500 MG tablet Take 1 tablet (500 mg total) by mouth daily. 90 tablet 0   No current facility-administered medications for this visit.    Family History  Problem Relation Age of Onset  . Colon cancer Father 63  . Diabetes Father   . Breast  cancer Maternal Aunt 18  . Breast cancer Maternal Aunt 45    Review of Systems  All other systems reviewed and are negative.   Exam:   BP 110/62   Pulse 68   Temp (!) 97.2 F (36.2 C) (Skin)   Resp 16   Ht 5' 0.5" (1.537 m)   Wt 145 lb (65.8 kg)   LMP 08/18/2011   BMI 27.85 kg/m    Height: 5' 0.5" (153.7 cm)  Ht Readings from Last 3 Encounters:  09/13/19 5' 0.5" (1.537 m)  04/13/18 5' 0.75" (1.543 m)  01/27/17 5' 0.5" (1.537 m)    General appearance: alert, cooperative and appears stated age Head: Normocephalic, without obvious abnormality, atraumatic Neck: no adenopathy, supple, symmetrical, trachea midline and thyroid normal to inspection and palpation Lungs: clear to auscultation bilaterally Breasts: normal appearance, no masses or tenderness Heart: regular rate and rhythm Abdomen: soft, non-tender; bowel sounds normal; no masses,  no organomegaly Extremities: extremities normal, atraumatic, no cyanosis or edema Skin: Skin color, texture, turgor normal. No rashes or lesions Lymph nodes: Cervical, supraclavicular, and axillary nodes normal. No abnormal inguinal nodes palpated Neurologic: Grossly normal   Pelvic: External genitalia:  no lesions              Urethra:  normal appearing urethra  with no masses, tenderness or lesions              Bartholins and Skenes: normal                 Vagina: normal appearing vagina with normal color and discharge, no lesions              Cervix: absent              Pap taken: Yes.   Bimanual Exam:  Uterus:  uterus absent              Adnexa: no mass, fullness, tenderness               Rectovaginal: Confirms               Anus:  normal sphincter tone, no lesions  Chaperone, Terence Lux, CMA, was present for exam.  A:  Well Woman with normal exam Grade 4 breast density Family hx of colon cancer in her father, diagnosed aged 79 H/o HSV  P:   Mammogram guidelines reviewed pap smear and HR HPV obtained today CBC, CMP,  Lipids, TSH and Vit D Discussed shingrix vaccination discussed. RF for Valtrex 500mg  daily.  #90/4RF RF for Omeprazole 40mg  daily  #90/0RF Return annually or prn

## 2019-09-13 ENCOUNTER — Other Ambulatory Visit (HOSPITAL_COMMUNITY)
Admission: RE | Admit: 2019-09-13 | Discharge: 2019-09-13 | Disposition: A | Discharge status: Home / Self Care | Payer: BC Managed Care – PPO | Source: Ambulatory Visit | Attending: Obstetrics & Gynecology | Admitting: Obstetrics & Gynecology

## 2019-09-13 ENCOUNTER — Encounter: Payer: Self-pay | Admitting: Obstetrics & Gynecology

## 2019-09-13 ENCOUNTER — Other Ambulatory Visit: Payer: Self-pay

## 2019-09-13 ENCOUNTER — Ambulatory Visit: Payer: BC Managed Care – PPO | Admitting: Obstetrics & Gynecology

## 2019-09-13 VITALS — BP 110/62 | HR 68 | Temp 97.2°F | Resp 16 | Ht 60.5 in | Wt 145.0 lb

## 2019-09-13 DIAGNOSIS — Z1211 Encounter for screening for malignant neoplasm of colon: Secondary | ICD-10-CM

## 2019-09-13 DIAGNOSIS — Z800 Family history of malignant neoplasm of digestive organs: Secondary | ICD-10-CM

## 2019-09-13 DIAGNOSIS — Z124 Encounter for screening for malignant neoplasm of cervix: Secondary | ICD-10-CM | POA: Diagnosis not present

## 2019-09-13 DIAGNOSIS — Z87410 Personal history of cervical dysplasia: Secondary | ICD-10-CM | POA: Insufficient documentation

## 2019-09-13 DIAGNOSIS — Z0000 Encounter for general adult medical examination without abnormal findings: Secondary | ICD-10-CM | POA: Diagnosis not present

## 2019-09-13 DIAGNOSIS — Z01419 Encounter for gynecological examination (general) (routine) without abnormal findings: Secondary | ICD-10-CM | POA: Diagnosis not present

## 2019-09-13 MED ORDER — OMEPRAZOLE 40 MG PO CPDR: DELAYED_RELEASE_CAPSULE | ORAL | 0 refills | Status: DC

## 2019-09-13 MED ORDER — VALACYCLOVIR HCL 500 MG PO TABS: 500.0000 mg | ORAL_TABLET | Freq: Every day | ORAL | 4 refills | Status: DC

## 2019-09-14 LAB — LIPID PANEL
Chol/HDL Ratio: 2.9 ratio (ref 0.0–4.4)
Cholesterol, Total: 239 mg/dL — ABNORMAL HIGH (ref 100–199)
HDL: 82 mg/dL (ref >39)
LDL Chol Calc (NIH): 117 mg/dL — ABNORMAL HIGH (ref 0–99)
Triglycerides: 234 mg/dL — ABNORMAL HIGH (ref 0–149)
VLDL Cholesterol Cal: 40 mg/dL (ref 5–40)

## 2019-09-14 LAB — COMPREHENSIVE METABOLIC PANEL
ALT: 28 IU/L (ref 0–32)
AST: 21 IU/L (ref 0–40)
Albumin/Globulin Ratio: 2 (ref 1.2–2.2)
Albumin: 4.6 g/dL (ref 3.8–4.8)
Alkaline Phosphatase: 97 IU/L (ref 39–117)
BUN/Creatinine Ratio: 17 (ref 9–23)
BUN: 12 mg/dL (ref 6–24)
Bilirubin Total: 0.3 mg/dL (ref 0.0–1.2)
CO2: 28 mmol/L (ref 20–29)
Calcium: 9.3 mg/dL (ref 8.7–10.2)
Chloride: 100 mmol/L (ref 96–106)
Creatinine, Ser: 0.71 mg/dL (ref 0.57–1.00)
GFR calc Af Amer: 117 mL/min/{1.73_m2} (ref >59)
GFR calc non Af Amer: 102 mL/min/{1.73_m2} (ref >59)
Globulin, Total: 2.3 g/dL (ref 1.5–4.5)
Glucose: 93 mg/dL (ref 65–99)
Potassium: 3.7 mmol/L (ref 3.5–5.2)
Sodium: 140 mmol/L (ref 134–144)
Total Protein: 6.9 g/dL (ref 6.0–8.5)

## 2019-09-14 LAB — CBC
Hematocrit: 43.1 % (ref 34.0–46.6)
Hemoglobin: 14.4 g/dL (ref 11.1–15.9)
MCH: 29.6 pg (ref 26.6–33.0)
MCHC: 33.4 g/dL (ref 31.5–35.7)
MCV: 89 fL (ref 79–97)
Platelets: 312 10*3/uL (ref 150–450)
RBC: 4.86 x10E6/uL (ref 3.77–5.28)
RDW: 12.2 % (ref 11.7–15.4)
WBC: 8.8 10*3/uL (ref 3.4–10.8)

## 2019-09-14 LAB — CYTOLOGY - PAP
Comment: NEGATIVE
Diagnosis: NEGATIVE
High risk HPV: NEGATIVE

## 2019-09-14 LAB — TSH: TSH: 1.25 u[IU]/mL (ref 0.450–4.500)

## 2019-09-14 LAB — VITAMIN D 25 HYDROXY (VIT D DEFICIENCY, FRACTURES): Vit D, 25-Hydroxy: 13.8 ng/mL — ABNORMAL LOW (ref 30.0–100.0)

## 2019-09-15 ENCOUNTER — Telehealth: Payer: Self-pay

## 2019-09-15 NOTE — Telephone Encounter (Signed)
-----   Message from Megan Salon, MD sent at 09/14/2019  3:46 PM EST ----- Please let pt know her CBC, CMP, and TSH were normal.  Vit D was low at 13.  Needs Vit D 50k weekly x 12 weeks and repeat testing after that time.  Lipids were mildly elevated but it was an afternoon visit so this is likely part of the cause.  This is not in a range where treatment is needed.  When it is repeated in a year, we should try to do a fasting lipid panel at that time.  Lastly, pap and HR HPV were both negative.  02 recall.  Thanks.

## 2019-09-15 NOTE — Telephone Encounter (Signed)
Tried calling patient to discuss results as written below by provider. No answer, left message for patient to call me back at [phone].

## 2019-09-19 ENCOUNTER — Encounter: Payer: Self-pay | Admitting: Internal Medicine

## 2019-09-21 NOTE — Telephone Encounter (Signed)
Patient returning call for results 

## 2019-09-21 NOTE — Telephone Encounter (Signed)
Spoke to pt. Pt states seeing results on mychart. Pt states started taking 10K daily of Vitamin D last week. Pt instructed to take for 5 days a week for recommended amount. Pt agreeable and refused 50K Rx at this time. Lab appt made for 11/22/19 after 12 weeks of taking Vit D.  Pt was concerned about lipid panel,but instructed to no further treatment at this time. Pt agreeable to fasting labs at next AEX which is scheduled for 11/23/2020. Recall placed. Routing to provider for final review. Patient is agreeable to disposition. Will close encounter.

## 2019-09-30 DIAGNOSIS — M7551 Bursitis of right shoulder: Secondary | ICD-10-CM | POA: Diagnosis not present

## 2019-10-18 ENCOUNTER — Ambulatory Visit: Payer: BC Managed Care – PPO

## 2019-11-07 ENCOUNTER — Encounter: Payer: Self-pay | Admitting: Internal Medicine

## 2019-11-07 ENCOUNTER — Ambulatory Visit: Payer: BC Managed Care – PPO

## 2019-11-07 ENCOUNTER — Telehealth: Payer: Self-pay | Admitting: *Deleted

## 2019-11-07 ENCOUNTER — Other Ambulatory Visit: Payer: Self-pay

## 2019-11-07 NOTE — Telephone Encounter (Signed)
Noted  

## 2019-11-07 NOTE — Telephone Encounter (Signed)
PATIENT WAS A NO SHOW AND LETTER SENT  °

## 2019-11-21 ENCOUNTER — Telehealth: Payer: Self-pay | Admitting: Obstetrics & Gynecology

## 2019-11-21 DIAGNOSIS — E7889 Other lipoprotein metabolism disorders: Secondary | ICD-10-CM

## 2019-11-21 DIAGNOSIS — E559 Vitamin D deficiency, unspecified: Secondary | ICD-10-CM

## 2019-11-21 NOTE — Telephone Encounter (Signed)
Patient cancelled lab for vitamin D and would like an order sent to [facility] to have done there.

## 2019-11-21 NOTE — Telephone Encounter (Signed)
Left message to call Trinitee Horgan, RN at [facility] [phone].   

## 2019-11-22 ENCOUNTER — Other Ambulatory Visit: Payer: Self-pay

## 2019-11-23 NOTE — Telephone Encounter (Signed)
Patient returned a call to [place]. Patient will be available after 3:00pm.

## 2019-11-23 NOTE — Telephone Encounter (Signed)
Left message for pt to call back to Wiley Magan, RN in triage. 

## 2019-11-25 NOTE — Telephone Encounter (Signed)
Spoke with patient. Patient to return for fasting lipid panel and Vit D. Patient is requesting lab orders to PCP to have drawn.   Advised patient unable to send lab order to PCP.   Reviewed options:  1. Patient can check with PCP to see if they will order and draw labs, fax results to Dr. Sabra Heck once completed.   2. Can send lab order to outside Lab.   3. Return to [facility] for labs.   Patient is going to check with PCP and return call to advise how she would like to proceed.

## 2019-11-25 NOTE — Telephone Encounter (Signed)
Patient is returning call to Jill, RN.  °

## 2019-11-30 NOTE — Telephone Encounter (Signed)
Spoke with patient. Patient is requesting lab orders to outside [facility] facility.   Future lab orders placed for lipid panel and Vit D. Orders released.   Advised patient nothing to eat or drink after midnight before labs, other than water and black coffee. Recommended patient check with the [facility] location she plans to go to for hours and if appt is needed. Patient verbalizes understanding and is agreeable.   Patient will notify office once labs have been drawn.   Routing to provider for final review. Patient is agreeable to disposition. Will close encounter.

## 2019-12-06 DIAGNOSIS — E7889 Other lipoprotein metabolism disorders: Secondary | ICD-10-CM | POA: Diagnosis not present

## 2019-12-06 DIAGNOSIS — E559 Vitamin D deficiency, unspecified: Secondary | ICD-10-CM | POA: Diagnosis not present

## 2019-12-07 ENCOUNTER — Telehealth: Payer: Self-pay | Admitting: *Deleted

## 2019-12-07 LAB — LIPID PANEL
Chol/HDL Ratio: 2.6 ratio (ref 0.0–4.4)
Cholesterol, Total: 218 mg/dL — ABNORMAL HIGH (ref 100–199)
HDL: 84 mg/dL (ref >39)
LDL Chol Calc (NIH): 121 mg/dL — ABNORMAL HIGH (ref 0–99)
Triglycerides: 74 mg/dL (ref 0–149)
VLDL Cholesterol Cal: 13 mg/dL (ref 5–40)

## 2019-12-07 LAB — VITAMIN D 25 HYDROXY (VIT D DEFICIENCY, FRACTURES): Vit D, 25-Hydroxy: 62.2 ng/mL (ref 30.0–100.0)

## 2019-12-07 NOTE — Telephone Encounter (Signed)
Message left to return call to [place] or Braidan Ricciardi at [phone].    

## 2019-12-07 NOTE — Telephone Encounter (Signed)
-----   Message from Megan Salon, MD sent at 12/07/2019  6:40 AM EDT ----- Please let pt know her her Vit D is 62.  I don't have this as a supplement in her list.  If she's taking some, that is fine but she does not need to increase it any further.  If she's not, it's in the normal range but she does not need supplementation.  Her Lipids were mildly elevated at 218 and her LDLs were 121. This does not need treatment.  It is ok to watch and repeat in a year or two.  Thanks.  She is on mychart but doesn't really use it.

## 2019-12-08 NOTE — Telephone Encounter (Signed)
Patient is returning call for Carmen Robles or Carmen Robles.

## 2019-12-13 NOTE — Telephone Encounter (Signed)
Results were discussed with patient 12/08/19. Okay to close encounter.

## 2019-12-30 DIAGNOSIS — E663 Overweight: Secondary | ICD-10-CM | POA: Diagnosis not present

## 2019-12-30 DIAGNOSIS — R519 Headache, unspecified: Secondary | ICD-10-CM | POA: Diagnosis not present

## 2019-12-30 DIAGNOSIS — Z6826 Body mass index (BMI) 26.0-26.9, adult: Secondary | ICD-10-CM | POA: Diagnosis not present

## 2019-12-30 DIAGNOSIS — R58 Hemorrhage, not elsewhere classified: Secondary | ICD-10-CM | POA: Diagnosis not present

## 2020-04-16 DIAGNOSIS — M542 Cervicalgia: Secondary | ICD-10-CM | POA: Diagnosis not present

## 2020-04-16 DIAGNOSIS — M546 Pain in thoracic spine: Secondary | ICD-10-CM | POA: Diagnosis not present

## 2020-04-16 DIAGNOSIS — M9902 Segmental and somatic dysfunction of thoracic region: Secondary | ICD-10-CM | POA: Diagnosis not present

## 2020-04-16 DIAGNOSIS — M9901 Segmental and somatic dysfunction of cervical region: Secondary | ICD-10-CM | POA: Diagnosis not present

## 2020-05-14 DIAGNOSIS — Z1231 Encounter for screening mammogram for malignant neoplasm of breast: Secondary | ICD-10-CM | POA: Diagnosis not present

## 2020-05-30 ENCOUNTER — Telehealth: Payer: Self-pay | Admitting: Obstetrics & Gynecology

## 2020-05-30 DIAGNOSIS — Z800 Family history of malignant neoplasm of digestive organs: Secondary | ICD-10-CM

## 2020-05-30 NOTE — Telephone Encounter (Signed)
Dr Sabra Heck wanted patient to schedule a colonoscopy and she's wondering if the office going to call to get her scheduled or is she scheduling.

## 2020-05-30 NOTE — Telephone Encounter (Signed)
Call to patient, voicemail full, unable to leave message.   Per review of Epic, referral was placed to GI/Dr. Gala Romney 09/13/19 to be scheduled after patient tuned 48 on 11/10/19. Referral was closed.  Family Hx of colon cancer in father at age 48.  Routing to [employer]  to schedule.

## 2020-05-30 NOTE — Telephone Encounter (Signed)
Spoke with patient. Patient states she had declined to schedule in 10/2019. Request new referral and to proceed with scheduling. Advised I have placed a new referral, our office will referral coordinator will f/u with appt details once scheduled. Patient verbalizes understanding and thankful for call.   Routing to provider for final review. Patient is agreeable to disposition. Will close encounter.

## 2020-06-04 ENCOUNTER — Encounter: Payer: Self-pay | Admitting: Internal Medicine

## 2020-07-23 ENCOUNTER — Ambulatory Visit: Payer: BC Managed Care – PPO

## 2020-07-30 ENCOUNTER — Ambulatory Visit: Payer: BC Managed Care – PPO

## 2020-07-31 ENCOUNTER — Encounter: Payer: Self-pay | Admitting: Obstetrics & Gynecology

## 2020-08-01 ENCOUNTER — Other Ambulatory Visit: Payer: Self-pay

## 2020-08-01 ENCOUNTER — Ambulatory Visit (INDEPENDENT_AMBULATORY_CARE_PROVIDER_SITE_OTHER): Payer: Self-pay | Admitting: *Deleted

## 2020-08-01 VITALS — Ht 61.0 in | Wt 149.0 lb

## 2020-08-01 DIAGNOSIS — Z1211 Encounter for screening for malignant neoplasm of colon: Secondary | ICD-10-CM

## 2020-08-01 NOTE — Progress Notes (Signed)
Pt came in and did triage visit.  She informed me that she would be unable to schedule procedure right now because she just started a new job and would not be able to take off to quarantine and for procedure.  She requested a procedure in March.  Informed her that we could just reschedule her nurse visit in late Jan so we could aim for a Mar procedure.  Pt voiced understanding.  Routing to [employer], [place] as Juluis Rainier.

## 2020-08-01 NOTE — Progress Notes (Signed)
Gastroenterology Pre-Procedure Review  Request Date: 08/01/2020 Requesting Physician: Dr. Hale Bogus @ [facility], Last TCS done 2007 by Dr. Arnoldo Morale, family hx of colon cancer  PATIENT REVIEW QUESTIONS: The patient responded to the following health history questions as indicated:    1. Diabetes Melitis: no 2. Joint replacements in the past 12 months: no 3. Major health problems in the past 3 months: no 4. Has an artificial valve or MVP: no 5. Has a defibrillator: no 6. Has been advised in past to take antibiotics in advance of a procedure like teeth cleaning: no 7. Family history of colon cancer: yes, father: age 49  8. Alcohol Use: yes, 1 glass of wine a month 9. Illicit drug Use: no 10. History of sleep apnea: no  11. History of coronary artery or other vascular stents placed within the last 12 months: no 12. History of any prior anesthesia complications: no 13. Body mass index is 28.15 kg/m.    MEDICATIONS & ALLERGIES:    Patient reports the following regarding taking any blood thinners:   Plavix? no Aspirin? yes Coumadin? no Brilinta? no Xarelto? no Eliquis? no Pradaxa? no Savaysa? no Effient? no  Patient confirms/reports the following medications:  Current Outpatient Medications  Medication Sig Dispense Refill  . aspirin EC 81 MG tablet Take 81 mg by mouth daily. Swallow whole.    . Ibuprofen (ADVIL) 200 MG CAPS Take by mouth as needed.     . Magnesium 250 MG TABS Take by mouth daily.    Marland Kitchen omeprazole (PRILOSEC) 40 MG capsule TAKE 1 CAPSULE BY MOUTH EVERY DAY (Patient taking differently: as needed. TAKE 1 CAPSULE BY MOUTH EVERY DAY) 90 capsule 0  . valACYclovir (VALTREX) 500 MG tablet Take 1 tablet (500 mg total) by mouth daily. 90 tablet 4  . vitamin B-12 (CYANOCOBALAMIN) 500 MCG tablet Take 500 mcg by mouth daily.     No current facility-administered medications for this visit.    Patient confirms/reports the following allergies:  Allergies   Allergen Reactions  . Codeine Nausea And Vomiting    No orders of the defined types were placed in this encounter.   AUTHORIZATION INFORMATION Primary Insurance: [place],  [place] #: [id number],  Group #: [id number] Pre-Cert / Josem Kaufmann required:  Pre-Cert / Auth #:   SCHEDULE INFORMATION: Procedure has been scheduled as follows:  Date: , Time:  Location:   This Gastroenterology Pre-Precedure Review Form is being routed to the following provider(s): Aliene Altes, [place]

## 2020-08-02 NOTE — Progress Notes (Signed)
Noted  

## 2020-08-07 ENCOUNTER — Encounter (INDEPENDENT_AMBULATORY_CARE_PROVIDER_SITE_OTHER): Payer: Self-pay | Admitting: *Deleted

## 2020-08-07 ENCOUNTER — Telehealth: Payer: Self-pay

## 2020-08-07 DIAGNOSIS — Z1211 Encounter for screening for malignant neoplasm of colon: Secondary | ICD-10-CM

## 2020-08-07 DIAGNOSIS — Z800 Family history of malignant neoplasm of digestive organs: Secondary | ICD-10-CM

## 2020-08-07 NOTE — Telephone Encounter (Signed)
Patient is calling in regards to wanting a different referral placed to another provider for Gastroenterology.

## 2020-08-07 NOTE — Telephone Encounter (Signed)
Spoke with pt. Pt states wanting new referral to GI. Pt was seen by [facility] with Nurse consult yesterday (08/06/20). Pt states thought it was Dr Olevia Perches office. Would like new referral placed to him. Pt has started new job and would like referral appt in either Jan or Feb 2022. This will be pt's first colonoscopy due to [place] of colon cancer.  Pt states aware Dr Sabra Heck not in office here and has plans to transfer to [facility] once has return call for scheduling.  Referral placed to Dr Coralee Rud Cc: Dr Sabra Heck for review.  Encounter closed

## 2020-08-29 DIAGNOSIS — L578 Other skin changes due to chronic exposure to nonionizing radiation: Secondary | ICD-10-CM | POA: Diagnosis not present

## 2020-08-29 DIAGNOSIS — L570 Actinic keratosis: Secondary | ICD-10-CM | POA: Diagnosis not present

## 2020-08-29 DIAGNOSIS — Z85828 Personal history of other malignant neoplasm of skin: Secondary | ICD-10-CM | POA: Diagnosis not present

## 2020-08-29 DIAGNOSIS — L821 Other seborrheic keratosis: Secondary | ICD-10-CM | POA: Diagnosis not present

## 2020-08-29 DIAGNOSIS — L814 Other melanin hyperpigmentation: Secondary | ICD-10-CM | POA: Diagnosis not present

## 2020-09-17 DIAGNOSIS — Z20822 Contact with and (suspected) exposure to covid-19: Secondary | ICD-10-CM | POA: Diagnosis not present

## 2020-10-10 ENCOUNTER — Other Ambulatory Visit: Payer: Self-pay

## 2020-10-10 ENCOUNTER — Ambulatory Visit (INDEPENDENT_AMBULATORY_CARE_PROVIDER_SITE_OTHER): Payer: Self-pay | Admitting: *Deleted

## 2020-10-10 VITALS — Ht 60.5 in | Wt 151.4 lb

## 2020-10-10 DIAGNOSIS — Z1211 Encounter for screening for malignant neoplasm of colon: Secondary | ICD-10-CM

## 2020-10-10 MED ORDER — NA SULFATE-K SULFATE-MG SULF 17.5-3.13-1.6 GM/177ML PO SOLN: 1.0000 | Freq: Once | ORAL | 0 refills | Status: AC

## 2020-10-10 NOTE — Patient Instructions (Signed)
Carmen Robles  09/13/1971 MRN: [id number]     Procedure Date: 11/05/2020 Time to register: 7:30 am Place to register: [facility] Procedure Time: 9:00 am Scheduled provider: Dr. Abbey Chatters    PREPARATION FOR COLONOSCOPY WITH SUPREP BOWEL PREP KIT  Note: Suprep Bowel Prep Kit is a split-dose (2day) regimen. Consumption of BOTH 6-ounce bottles is required for a complete prep.  Please notify us immediately if you are diabetic, take iron supplements, or if you are on Coumadin or any other blood thinners.  Please hold the following medications: n/a                                                                                                                                                  2 DAYS BEFORE PROCEDURE:  DATE: 11/03/2020   DAY: Saturday Begin clear liquid diet AFTER your lunch meal. NO SOLID FOODS after this point.  1 DAY BEFORE PROCEDURE:  DATE: 11/04/2020   DAY: Sunday Continue clear liquids the entire day - NO SOLID FOOD.   Diabetic medications adjustments for today: n/a  At 8:00am: Complete steps 1 through 4 below, using ONE (1) 6-ounce bottle, before going to bed. Step 1:  Pour ONE (1) 6-ounce bottle of SUPREP liquid into the mixing container.  Step 2:  Add cool drinking water to the 16 ounce line on the container and mix.  Note: Dilute the solution concentrate as directed prior to use. Step 3:  DRINK ALL the liquid in the container. Step 4:  You MUST drink an additional two (2) or more 16 ounce containers of water over the next one (1) hour.    At 6:00pm: Complete steps 1 through 4 below, using ONE (1) 6-ounce bottle, before going to bed. Step 1:  Pour ONE (1) 6-ounce bottle of SUPREP liquid into the mixing container.  Step 2:  Add cool drinking water to the 16 ounce line on the container and mix.  Note: Dilute the solution concentrate as directed prior to use. Step 3:  DRINK ALL the liquid in the container. Step 4:  You MUST drink an additional two (2) or  more 16 ounce containers of water over the next one (1) hour.   Continue clear liquids.  Nothing by mouth after midnight.  DAY OF PROCEDURE:   DATE: 11/05/2020   DAY: Monday If you take medications for your heart, blood pressure, or breathing, you may take these medications.  Diabetic medications adjustments for today: n/a  You may take your morning medications with sip of water unless we have instructed otherwise.    Please see below for Dietary Information.  CLEAR LIQUIDS INCLUDE:  Water Jello (NOT red in color)   Ice Popsicles (NOT red in color)   Tea (sugar ok, no milk/cream) Powdered fruit flavored drinks  Coffee (sugar ok, no milk/cream) Gatorade/ Lemonade/ Kool-Aid  (  NOT red in color)   Juice: apple, white grape, white cranberry Soft drinks  Clear bullion, consomme, broth (fat free beef/chicken/vegetable)  Carbonated beverages (any kind)  Strained chicken noodle soup Hard Candy   Remember: Clear liquids are liquids that will allow you to see your fingers on the other side of a clear glass. Be sure liquids are NOT red in color, and not cloudy, but CLEAR.  DO NOT EAT OR DRINK ANY OF THE FOLLOWING:  Dairy products of any kind   Cranberry juice Tomato juice / V8 juice   Grapefruit juice Orange juice     Red grape juice  Do not eat any solid foods, including such foods as: cereal, oatmeal, yogurt, fruits, vegetables, creamed soups, eggs, bread, crackers, pureed foods in a blender, etc.   HELPFUL HINTS FOR DRINKING PREP SOLUTION:   Make sure prep is extremely cold. Mix and refrigerate the the morning of the prep. You may also put in the freezer.   You may try mixing some Crystal Light or Country Time Lemonade if you prefer. Mix in small amounts; add more if necessary.  Try drinking through a straw  Rinse mouth with water or a mouthwash between glasses, to remove after-taste.  Try sipping on a cold beverage /ice/ popsicles between glasses of prep.  Place a piece of  sugar-free hard candy in mouth between glasses.  If you become nauseated, try consuming smaller amounts, or stretch out the time between glasses. Stop for 30-60 minutes, then slowly start back drinking.     OTHER INSTRUCTIONS  You will need a responsible adult at least 49 years of age to accompany you and drive you home. This person must remain in the waiting room during your procedure. The hospital will cancel your procedure if you do not have a responsible adult with you.   1. Wear loose fitting clothing that is easily removed. 2. Leave jewelry and other valuables at home.  3. Remove all body piercing jewelry and leave at home. 4. Total time from sign-in until discharge is approximately 2-3 hours. 5. You should go home directly after your procedure and rest. You can resume normal activities the day after your procedure. 6. The day of your procedure you should not:  Drive  Make legal decisions  Operate machinery  Drink alcohol  Return to work   You may call the office (Dept: [phone]) before 5:00pm, or page the doctor on call [phone]) after 5:00pm, for further instructions, if necessary.   Insurance Information YOU WILL NEED TO CHECK WITH YOUR INSURANCE COMPANY FOR THE BENEFITS OF COVERAGE YOU HAVE FOR THIS PROCEDURE.  UNFORTUNATELY, NOT ALL INSURANCE COMPANIES HAVE BENEFITS TO COVER ALL OR PART OF THESE TYPES OF PROCEDURES.  IT IS YOUR RESPONSIBILITY TO CHECK YOUR BENEFITS, HOWEVER, WE WILL BE GLAD TO ASSIST YOU WITH ANY CODES YOUR INSURANCE COMPANY MAY NEED.    PLEASE NOTE THAT MOST INSURANCE COMPANIES WILL NOT COVER A SCREENING COLONOSCOPY FOR PEOPLE UNDER THE AGE OF 50  IF YOU HAVE BCBS INSURANCE, YOU MAY HAVE BENEFITS FOR A SCREENING COLONOSCOPY BUT IF POLYPS ARE FOUND THE DIAGNOSIS WILL CHANGE AND THEN YOU MAY HAVE A DEDUCTIBLE THAT WILL NEED TO BE MET. SO PLEASE MAKE SURE YOU CHECK YOUR BENEFITS FOR A SCREENING COLONOSCOPY AS WELL AS A DIAGNOSTIC  COLONOSCOPY.

## 2020-10-10 NOTE — Progress Notes (Signed)
Gastroenterology Pre-Procedure Review  Request Date: 10/10/2020 Requesting Physician: Dr. Hale Bogus @ [facility], Last TCS done 2007 by Dr. Arnoldo Morale, no polyps, family hx of colon cancer (father)  PATIENT REVIEW QUESTIONS: The patient responded to the following health history questions as indicated:    1. Diabetes Melitis: no 2. Joint replacements in the past 12 months: no 3. Major health problems in the past 3 months: no 4. Has an artificial valve or MVP: no 5. Has a defibrillator: no 6. Has been advised in past to take antibiotics in advance of a procedure like teeth cleaning: no 7. Family history of colon cancer: yes, father: age 29 8. Alcohol Use: yes, 1 glass of wine a month 9. Illicit drug Use: no 10. History of sleep apnea: no 11. History of coronary artery or other vascular stents placed within the last 12 months: no 12. History of any prior anesthesia complications: no 13. Body mass index is 29.08 kg/m.    MEDICATIONS & ALLERGIES:    Patient reports the following regarding taking any blood thinners:   Plavix? no Aspirin? yes Coumadin? no Brilinta? no Xarelto? no Eliquis? no Pradaxa? no Savaysa? no Effient? no  Patient confirms/reports the following medications:  Current Outpatient Medications  Medication Sig Dispense Refill  . aspirin EC 81 MG tablet Take 81 mg by mouth daily. Swallow whole.    . Ibuprofen 200 MG CAPS Take by mouth as needed.     . Magnesium 250 MG TABS Take by mouth daily.    Marland Kitchen omeprazole (PRILOSEC) 40 MG capsule TAKE 1 CAPSULE BY MOUTH EVERY DAY (Patient taking differently: as needed. TAKE 1 CAPSULE BY MOUTH EVERY DAY) 90 capsule 0  . valACYclovir (VALTREX) 500 MG tablet Take 1 tablet (500 mg total) by mouth daily. 90 tablet 4  . vitamin B-12 (CYANOCOBALAMIN) 500 MCG tablet Take 500 mcg by mouth daily.     No current facility-administered medications for this visit.    Patient confirms/reports the following allergies:   Allergies  Allergen Reactions  . Codeine Nausea And Vomiting    No orders of the defined types were placed in this encounter.   AUTHORIZATION INFORMATION Primary Insurance: [place],  [place] #:[id number] ,  Group #: [id number] Pre-Cert / Josem Kaufmann required:  Pre-Cert / Josem Kaufmann #:   SCHEDULE INFORMATION: Procedure has been scheduled as follows:  Date: 11/05/2020, Time: 9:00 Location: [facility] with Dr. Abbey Chatters  This Gastroenterology Pre-Precedure Review Form is being routed to the following provider(s): Neil Crouch, PA

## 2020-10-11 NOTE — Progress Notes (Signed)
Ok to schedule.  ASA II 

## 2020-10-13 ENCOUNTER — Telehealth: Payer: Self-pay | Admitting: Obstetrics & Gynecology

## 2020-10-15 NOTE — Telephone Encounter (Signed)
Pt is due her AEX.  Can you schedule her for this and let her know the Valtrex rx was done?  Thank you.

## 2020-10-15 NOTE — Telephone Encounter (Signed)
RF for Valtrex sent to pharmacy on file.

## 2020-11-02 ENCOUNTER — Encounter (HOSPITAL_COMMUNITY): Payer: Self-pay

## 2020-11-02 ENCOUNTER — Other Ambulatory Visit: Payer: Self-pay

## 2020-11-02 ENCOUNTER — Other Ambulatory Visit (HOSPITAL_COMMUNITY)
Admission: RE | Admit: 2020-11-02 | Discharge: 2020-11-02 | Disposition: A | Discharge status: Home / Self Care | Payer: BC Managed Care – PPO | Source: Ambulatory Visit | Attending: Internal Medicine | Admitting: Internal Medicine

## 2020-11-02 DIAGNOSIS — K635 Polyp of colon: Secondary | ICD-10-CM | POA: Diagnosis not present

## 2020-11-02 DIAGNOSIS — Z1211 Encounter for screening for malignant neoplasm of colon: Secondary | ICD-10-CM | POA: Diagnosis not present

## 2020-11-02 DIAGNOSIS — Z79899 Other long term (current) drug therapy: Secondary | ICD-10-CM | POA: Diagnosis not present

## 2020-11-02 DIAGNOSIS — Z6827 Body mass index (BMI) 27.0-27.9, adult: Secondary | ICD-10-CM | POA: Diagnosis not present

## 2020-11-02 DIAGNOSIS — Z833 Family history of diabetes mellitus: Secondary | ICD-10-CM | POA: Diagnosis not present

## 2020-11-02 DIAGNOSIS — Z885 Allergy status to narcotic agent status: Secondary | ICD-10-CM | POA: Diagnosis not present

## 2020-11-02 DIAGNOSIS — Z7982 Long term (current) use of aspirin: Secondary | ICD-10-CM | POA: Diagnosis not present

## 2020-11-02 DIAGNOSIS — Z803 Family history of malignant neoplasm of breast: Secondary | ICD-10-CM | POA: Diagnosis not present

## 2020-11-02 DIAGNOSIS — Z20822 Contact with and (suspected) exposure to covid-19: Secondary | ICD-10-CM | POA: Insufficient documentation

## 2020-11-02 DIAGNOSIS — Z1331 Encounter for screening for depression: Secondary | ICD-10-CM | POA: Diagnosis not present

## 2020-11-02 DIAGNOSIS — K5730 Diverticulosis of large intestine without perforation or abscess without bleeding: Secondary | ICD-10-CM | POA: Diagnosis not present

## 2020-11-02 DIAGNOSIS — E663 Overweight: Secondary | ICD-10-CM | POA: Diagnosis not present

## 2020-11-02 DIAGNOSIS — R42 Dizziness and giddiness: Secondary | ICD-10-CM | POA: Diagnosis not present

## 2020-11-02 DIAGNOSIS — Z01812 Encounter for preprocedural laboratory examination: Secondary | ICD-10-CM | POA: Insufficient documentation

## 2020-11-02 DIAGNOSIS — K648 Other hemorrhoids: Secondary | ICD-10-CM | POA: Diagnosis not present

## 2020-11-02 DIAGNOSIS — Z800 Family history of malignant neoplasm of digestive organs: Secondary | ICD-10-CM | POA: Diagnosis not present

## 2020-11-02 LAB — SARS CORONAVIRUS 2 (TAT 6-24 HRS): SARS Coronavirus 2: NEGATIVE

## 2020-11-05 ENCOUNTER — Encounter (HOSPITAL_COMMUNITY)
Admission: RE | Disposition: A | Discharge status: Home / Self Care | Payer: Self-pay | Source: Home / Self Care | Attending: Internal Medicine

## 2020-11-05 ENCOUNTER — Ambulatory Visit (HOSPITAL_COMMUNITY): Payer: BC Managed Care – PPO | Admitting: Certified Registered"

## 2020-11-05 ENCOUNTER — Encounter (HOSPITAL_COMMUNITY): Payer: Self-pay | Admitting: Family Medicine

## 2020-11-05 ENCOUNTER — Ambulatory Visit (HOSPITAL_COMMUNITY)
Admission: RE | Admit: 2020-11-05 | Discharge: 2020-11-05 | Disposition: A | Discharge status: Home / Self Care | Payer: BC Managed Care – PPO | Attending: Internal Medicine | Admitting: Internal Medicine

## 2020-11-05 ENCOUNTER — Other Ambulatory Visit: Payer: Self-pay

## 2020-11-05 DIAGNOSIS — Z800 Family history of malignant neoplasm of digestive organs: Secondary | ICD-10-CM

## 2020-11-05 DIAGNOSIS — K648 Other hemorrhoids: Secondary | ICD-10-CM | POA: Diagnosis not present

## 2020-11-05 DIAGNOSIS — Z803 Family history of malignant neoplasm of breast: Secondary | ICD-10-CM | POA: Diagnosis not present

## 2020-11-05 DIAGNOSIS — K5730 Diverticulosis of large intestine without perforation or abscess without bleeding: Secondary | ICD-10-CM | POA: Diagnosis not present

## 2020-11-05 DIAGNOSIS — Z1211 Encounter for screening for malignant neoplasm of colon: Secondary | ICD-10-CM

## 2020-11-05 DIAGNOSIS — Z20822 Contact with and (suspected) exposure to covid-19: Secondary | ICD-10-CM | POA: Diagnosis not present

## 2020-11-05 DIAGNOSIS — Z79899 Other long term (current) drug therapy: Secondary | ICD-10-CM | POA: Diagnosis not present

## 2020-11-05 DIAGNOSIS — Z833 Family history of diabetes mellitus: Secondary | ICD-10-CM | POA: Insufficient documentation

## 2020-11-05 DIAGNOSIS — Z7982 Long term (current) use of aspirin: Secondary | ICD-10-CM | POA: Insufficient documentation

## 2020-11-05 DIAGNOSIS — K635 Polyp of colon: Secondary | ICD-10-CM | POA: Diagnosis not present

## 2020-11-05 DIAGNOSIS — Z885 Allergy status to narcotic agent status: Secondary | ICD-10-CM | POA: Insufficient documentation

## 2020-11-05 DIAGNOSIS — K219 Gastro-esophageal reflux disease without esophagitis: Secondary | ICD-10-CM | POA: Diagnosis not present

## 2020-11-05 HISTORY — PX: POLYPECTOMY: SHX149

## 2020-11-05 HISTORY — PX: COLONOSCOPY WITH PROPOFOL: SHX5780

## 2020-11-05 SURGERY — COLONOSCOPY WITH PROPOFOL
Anesthesia: General

## 2020-11-05 MED ORDER — LACTATED RINGERS IV SOLN: INTRAVENOUS | Status: DC

## 2020-11-05 MED ORDER — CHLORHEXIDINE GLUCONATE CLOTH 2 % EX PADS: 6.0000 | MEDICATED_PAD | Freq: Once | CUTANEOUS | Status: DC

## 2020-11-05 MED ORDER — LIDOCAINE HCL (CARDIAC) PF 100 MG/5ML IV SOSY
PREFILLED_SYRINGE | INTRAVENOUS | Status: DC | PRN
  Administered 2020-11-05: 50 mg via INTRAVENOUS

## 2020-11-05 MED ORDER — STERILE WATER FOR IRRIGATION IR SOLN
Status: DC | PRN
  Administered 2020-11-05: 200 mL

## 2020-11-05 MED ORDER — PROPOFOL 10 MG/ML IV BOLUS
INTRAVENOUS | Status: DC | PRN
  Administered 2020-11-05: 100 mg via INTRAVENOUS
  Administered 2020-11-05: 30 mg via INTRAVENOUS
  Administered 2020-11-05: 40 mg via INTRAVENOUS
  Administered 2020-11-05: 30 mg via INTRAVENOUS
  Administered 2020-11-05: 40 mg via INTRAVENOUS
  Administered 2020-11-05: 30 mg via INTRAVENOUS

## 2020-11-05 MED ORDER — LACTATED RINGERS IV SOLN
INTRAVENOUS | Status: DC | PRN
  Administered 2020-11-05: 1000 mL via INTRAVENOUS

## 2020-11-05 NOTE — Transfer of Care (Signed)
Immediate Anesthesia Transfer of Care Note  Patient: Carmen Robles  Procedure(s) Performed: COLONOSCOPY WITH PROPOFOL (N/A ) POLYPECTOMY INTESTINAL  Patient Location: Endoscopy Unit  Anesthesia Type:General  Level of Consciousness: awake, alert  and oriented  Airway & Oxygen Therapy: Patient Spontanous Breathing  Post-op Assessment: Report given to RN, Post -op Vital signs reviewed and stable and Patient moving all extremities X 4  Post vital signs: Reviewed and stable  Last Vitals:  Vitals Value Taken Time  BP    Temp    Pulse    Resp    SpO2      Last Pain:  Vitals:   11/05/20 0800  TempSrc:   PainSc: 0-No pain      Patients Stated Pain Goal: 7 (25/74/93 5521)  Complications: No complications documented.

## 2020-11-05 NOTE — Anesthesia Preprocedure Evaluation (Signed)
Anesthesia Evaluation  Patient identified by MRN, date of birth, ID band Patient awake    Reviewed: Allergy & Precautions, H&P , NPO status , Patient's Chart, lab work & pertinent test results, reviewed documented beta blocker date and time   Airway Mallampati: II  TM Distance: >3 FB Neck ROM: full    Dental no notable dental hx.    Pulmonary neg pulmonary ROS,    Pulmonary exam normal breath sounds clear to auscultation       Cardiovascular Exercise Tolerance: Good negative cardio ROS   Rhythm:regular Rate:Normal     Neuro/Psych  Headaches, negative psych ROS   GI/Hepatic Neg liver ROS, hiatal hernia, GERD  Medicated,  Endo/Other  negative endocrine ROS  Renal/GU negative Renal ROS  negative genitourinary   Musculoskeletal   Abdominal   Peds  Hematology negative hematology ROS (+)   Anesthesia Other Findings   Reproductive/Obstetrics negative OB ROS                             Anesthesia Physical Anesthesia Plan  ASA: II  Anesthesia Plan: General   Post-op Pain Management:    Induction:   PONV Risk Score and Plan: Propofol infusion  Airway Management Planned:   Additional Equipment:   Intra-op Plan:   Post-operative Plan:   Informed Consent: I have reviewed the patients History and Physical, chart, labs and discussed the procedure including the risks, benefits and alternatives for the proposed anesthesia with the patient or authorized representative who has indicated his/her understanding and acceptance.     Dental Advisory Given  Plan Discussed with: CRNA  Anesthesia Plan Comments:         Anesthesia Quick Evaluation

## 2020-11-05 NOTE — Anesthesia Procedure Notes (Signed)
Date/Time: 11/05/2020 8:06 AM Performed by: Orlie Dakin, CRNA Pre-anesthesia Checklist: Patient identified, Emergency Drugs available, Suction available and Patient being monitored Patient Re-evaluated:Patient Re-evaluated prior to induction Oxygen Delivery Method: Nasal cannula Induction Type: IV induction Placement Confirmation: positive ETCO2

## 2020-11-05 NOTE — Op Note (Signed)
[facility] Patient Name: Carmen Robles Procedure Date: 11/05/2020 7:56 AM MRN: [id number] Date of Birth: 01/13/1971 Attending MD: Elon Alas. Abbey Chatters DO CSN: [id number] Age: 50 Admit Type: Outpatient Procedure:                Colonoscopy Indications:              Screening in patient at increased risk: Family                            history of 1st-degree relative with colorectal                            cancer before age 84 years Providers:                Elon Alas. Abbey Chatters, DO, Haven Page, [place]                            Risa Grill, Technician Referring MD:              Medicines:                See the Anesthesia note for documentation of the                            administered medications Complications:            No immediate complications. Estimated Blood Loss:     Estimated blood loss was minimal. Procedure:                Pre-Anesthesia Assessment:                           - The anesthesia plan was to use monitored                            anesthesia care (MAC).                           After obtaining informed consent, the colonoscope                            was passed under direct vision. Throughout the                            procedure, the patient's blood pressure, pulse, and                            oxygen saturations were monitored continuously. The                            PCF-HQ190L ([id number]) scope was introduced through                            the anus and advanced to the the cecum, identified                            by appendiceal orifice and ileocecal  valve. The                            colonoscopy was performed without difficulty. The                            patient tolerated the procedure well. The quality                            of the bowel preparation was evaluated using the                            BBPS [facility] Bowel Preparation Scale) with scores                            of: Right Colon = 3, Transverse  Colon = 3 and Left                            Colon = 3 (entire mucosa seen well with no residual                            staining, small fragments of stool or opaque                            liquid). The total BBPS score equals 9. Scope In: 8:04:37 AM Scope Out: 8:18:16 AM Scope Withdrawal Time: 0 hours 10 minutes 19 seconds  Total Procedure Duration: 0 hours 13 minutes 39 seconds  Findings:      The perianal and digital rectal examinations were normal.      Non-bleeding internal hemorrhoids were found during endoscopy.      A few small-mouthed diverticula were found in the sigmoid colon.      A 5 mm polyp was found in the transverse colon. The polyp was sessile.       The polyp was removed with a cold snare. Resection and retrieval were       complete.      The exam was otherwise without abnormality. Impression:               - Non-bleeding internal hemorrhoids.                           - Diverticulosis in the sigmoid colon.                           - One 5 mm polyp in the transverse colon, removed                            with a cold snare. Resected and retrieved.                           - The examination was otherwise normal. Moderate Sedation:      Per Anesthesia Care Recommendation:           - Patient has a contact number available for  emergencies. The signs and symptoms of potential                            delayed complications were discussed with the                            patient. Return to normal activities tomorrow.                            Written discharge instructions were provided to the                            patient.                           - Resume previous diet.                           - Continue present medications.                           - Await pathology results.                           - Repeat colonoscopy in 5 years for surveillance.                           - Return to GI clinic PRN. Procedure  Code(s):        --- Professional ---                           [phone], Colonoscopy, flexible; with removal of                            tumor(s), polyp(s), or other lesion(s) by snare                            technique Diagnosis Code(s):        --- Professional ---                           Z80.0, Family history of malignant neoplasm of                            digestive organs                           K64.8, Other hemorrhoids                           K63.5, Polyp of colon                           K57.30, Diverticulosis of large intestine without                            perforation or abscess without bleeding CPT copyright 2019 American Medical  Association. All rights reserved. The codes documented in this report are preliminary and upon coder review may  be revised to meet current compliance requirements. Elon Alas. Abbey Chatters, DO [place] Abbey Chatters, DO 11/05/2020 8:32:03 AM This report has been signed electronically. Number of Addenda: 0

## 2020-11-05 NOTE — H&P (Signed)
Primary Care Physician:  Sharilyn Sites, MD Primary Gastroenterologist:  Dr. Abbey Chatters  Pre-Procedure History & Physical: HPI:  Carmen Robles is a 50 y.o. female is here for a colonoscopy to be performed for high risk colon cancer screening purposes due to family history of colon cancer (father). Last CLN 2007 unremarkable per reports.  Patient denies any current  melena or hematochezia.  No abdominal pain or unintentional weight loss.  No change in bowel habits.  Overall feels well from a GI standpoint.  Past Medical History:  Diagnosis Date  . Abnormal Pap smear   . GERD (gastroesophageal reflux disease)   . Headache(784.0)   . Hiatal hernia   . HSV (herpes simplex virus) infection     Past Surgical History:  Procedure Laterality Date  . CRYOTHERAPY  1993   cervix  . CYSTOSCOPY  09/02/2011   Procedure: CYSTOSCOPY;  Surgeon: Felipa Emory;  Location: [facility];  Service: Gynecology;  Laterality: N/A;  . LAPAROSCOPIC ASSISTED VAGINAL HYSTERECTOMY  09/02/2011   Robotic/MSM   . LEEP  2005    Prior to Admission medications   Medication Sig Start Date End Date Taking? Authorizing Provider  aspirin EC 81 MG tablet Take 81 mg by mouth daily. Swallow whole.   Yes [provider]  Ibuprofen 200 MG CAPS Take 600 mg by mouth daily as needed (pain and headache).   Yes [provider]  Magnesium 250 MG TABS Take 250 mg by mouth daily.   Yes [provider]  omeprazole (PRILOSEC) 40 MG capsule TAKE 1 CAPSULE BY MOUTH EVERY DAY Patient taking differently: Take 20-40 mg by mouth daily as needed (Heartburn). 09/13/19  Yes Megan Salon, MD  valACYclovir (VALTREX) 500 MG tablet TAKE 1 TABLET BY MOUTH DAILY Patient taking differently: Take 500 mg by mouth daily. 10/15/20  Yes Megan Salon, MD  vitamin B-12 (CYANOCOBALAMIN) 1000 MCG tablet Take 1,000 mcg by mouth daily.   Yes [provider]    Allergies as of 10/11/2020 - Review Complete 10/10/2020  Allergen  Reaction Noted  . Codeine Nausea And Vomiting 08/28/2012    Family History  Problem Relation Age of Onset  . Colon cancer Father 53  . Diabetes Father   . Breast cancer Maternal Aunt 73  . Breast cancer Maternal Aunt 76    Social History   Socioeconomic History  . Marital status: Married    Spouse name: Not on file  . Number of children: Not on file  . Years of education: Not on file  . Highest education level: Not on file  Occupational History  . Not on file  Tobacco Use  . Smoking status: Never Smoker  . Smokeless tobacco: Never Used  Vaping Use  . Vaping Use: Never used  Substance and Sexual Activity  . Alcohol use: Yes    Alcohol/week: 0.0 - 1.0 standard drinks    Comment: occassionally  . Drug use: No  . Sexual activity: Yes    Partners: Male    Birth control/protection: Surgical    Comment: hysterectomy,vasectomy  Other Topics Concern  . Not on file  Social History Narrative  . Not on file   Social Determinants of Health   Financial Resource Strain: Not on file  Food Insecurity: Not on file  Transportation Needs: Not on file  Physical Activity: Not on file  Stress: Not on file  Social Connections: Not on file  Intimate Partner Violence: Not on file    Review of Systems:  See HPI, otherwise negative ROS  Impression/Plan: Carmen Robles is here for a colonoscopy to be performed for high risk colon cancer screening purposes due to family history of colon cancer (father)..  The risks of the procedure including infection, bleed, or perforation as well as benefits, limitations, alternatives and imponderables have been reviewed with the patient. Questions have been answered. All parties agreeable.

## 2020-11-05 NOTE — Anesthesia Postprocedure Evaluation (Signed)
Anesthesia Post Note  Patient: Carmen Robles  Procedure(s) Performed: COLONOSCOPY WITH PROPOFOL (N/A ) POLYPECTOMY INTESTINAL  Patient location during evaluation: Endoscopy Anesthesia Type: General Level of consciousness: awake and alert and oriented Pain management: pain level controlled Vital Signs Assessment: post-procedure vital signs reviewed and stable Respiratory status: spontaneous breathing, nonlabored ventilation and respiratory function stable Cardiovascular status: blood pressure returned to baseline and stable Postop Assessment: no apparent nausea or vomiting Anesthetic complications: no   No complications documented.   Last Vitals:  Vitals:   11/05/20 0715 11/05/20 0822  BP: 113/68 111/71  Pulse:  84  Resp: 16 (!) 24  Temp: 37 C 36.9 C  SpO2: 99% 99%    Last Pain:  Vitals:   11/05/20 0822  TempSrc: Oral  PainSc: 0-No pain                 Orlie Dakin

## 2020-11-05 NOTE — Discharge Instructions (Addendum)
Colonoscopy Discharge Instructions  Read the instructions outlined below and refer to this sheet in the next few weeks. These discharge instructions provide you with general information on caring for yourself after you leave the hospital. Your doctor may also give you specific instructions. While your treatment has been planned according to the most current medical practices available, unavoidable complications occasionally occur.   ACTIVITY  You may resume your regular activity, but move at a slower pace for the next 24 hours.   Take frequent rest periods for the next 24 hours.   Walking will help get rid of the air and reduce the bloated feeling in your belly (abdomen).   No driving for 24 hours (because of the medicine (anesthesia) used during the test).    Do not sign any important legal documents or operate any machinery for 24 hours (because of the anesthesia used during the test).  NUTRITION  Drink plenty of fluids.   You may resume your normal diet as instructed by your doctor.   Begin with a light meal and progress to your normal diet. Heavy or fried foods are harder to digest and may make you feel sick to your stomach (nauseated).   Avoid alcoholic beverages for 24 hours or as instructed.  MEDICATIONS  You may resume your normal medications unless your doctor tells you otherwise.  WHAT YOU CAN EXPECT TODAY  Some feelings of bloating in the abdomen.   Passage of more gas than usual.   Spotting of blood in your stool or on the toilet paper.  IF YOU HAD POLYPS REMOVED DURING THE COLONOSCOPY:  No aspirin products for 7 days or as instructed.   No alcohol for 7 days or as instructed.   Eat a soft diet for the next 24 hours.  FINDING OUT THE RESULTS OF YOUR TEST Not all test results are available during your visit. If your test results are not back during the visit, make an appointment with your caregiver to find out the results. Do not assume everything is normal if  you have not heard from your caregiver or the medical facility. It is important for you to follow up on all of your test results.  SEEK IMMEDIATE MEDICAL ATTENTION IF:  You have more than a spotting of blood in your stool.   Your belly is swollen (abdominal distention).   You are nauseated or vomiting.   You have a temperature over 101.   You have abdominal pain or discomfort that is severe or gets worse throughout the day.   Your colonoscopy revealed 1 polyp(s) which I removed successfully. Await pathology results, my office will contact you. I recommend repeating colonoscopy in 5 years for surveillance purposes.    I hope you have a great rest of your week!  Elon Alas. Abbey Chatters, D.O. Gastroenterology and Hepatology [facility]   Colon Polyps  Colon polyps are tissue growths inside the colon, which is part of the large intestine. They are one of the types of polyps that can grow in the body. A polyp may be a round bump or a mushroom-shaped growth. You could have one polyp or more than one. Most colon polyps are noncancerous (benign). However, some colon polyps can become cancerous over time. Finding and removing the polyps early can help prevent this. What are the causes? The exact cause of colon polyps is not known. What increases the risk? The following factors may make you more likely to develop this condition:  Having a family  history of colorectal cancer or colon polyps.  Being older than 49 years of age.  Being younger than 49 years of age and having a significant family history of colorectal cancer or colon polyps or a genetic condition that puts you at higher risk of getting colon polyps.  Having inflammatory bowel disease, such as ulcerative colitis or Crohn's disease.  Having certain conditions passed from parent to child (hereditary conditions), such as: ? Familial adenomatous polyposis (FAP). ? Lynch syndrome. ? Turcot  syndrome. ? Peutz-Jeghers syndrome. ? MUTYH-associated polyposis (MAP).  Being overweight.  Certain lifestyle factors. These include smoking cigarettes, drinking too much alcohol, not getting enough exercise, and eating a diet that is high in fat and red meat and low in fiber.  Having had childhood cancer that was treated with radiation of the abdomen. What are the signs or symptoms? Many times, there are no symptoms. If you have symptoms, they may include:  Blood coming from the rectum during a bowel movement.  Blood in the stool (feces). The blood may be bright red or very dark in color.  Pain in the abdomen.  A change in bowel habits, such as constipation or diarrhea. How is this diagnosed? This condition is diagnosed with a colonoscopy. This is a procedure in which a lighted, flexible scope is inserted into the opening between the buttocks (anus) and then passed into the colon to examine the area. Polyps are sometimes found when a colonoscopy is done as part of routine cancer screening tests. How is this treated? This condition is treated by removing any polyps that are found. Most polyps can be removed during a colonoscopy. Those polyps will then be tested for cancer. Additional treatment may be needed depending on the results of testing. Follow these instructions at home: Eating and drinking  Eat foods that are high in fiber, such as fruits, vegetables, and whole grains.  Eat foods that are high in calcium and vitamin D, such as milk, cheese, yogurt, eggs, liver, fish, and broccoli.  Limit foods that are high in fat, such as fried foods and desserts.  Limit the amount of red meat, precooked or cured meat, or other processed meat that you eat, such as hot dogs, sausages, bacon, or meat loaves.  Limit sugary drinks.   Lifestyle  Maintain a healthy weight, or lose weight if recommended by your health care provider.  Exercise every day or as told by your health care  provider.  Do not use any products that contain nicotine or tobacco, such as cigarettes, e-cigarettes, and chewing tobacco. If you need help quitting, ask your health care provider.  Do not drink alcohol if: ? Your health care provider tells you not to drink. ? You are pregnant, may be pregnant, or are planning to become pregnant.  If you drink alcohol: ? Limit how much you use to:  0-1 drink a day for women.  0-2 drinks a day for men. ? Know how much alcohol is in your drink. In the [place] one drink equals one 12 oz bottle of beer (355 mL), one 5 oz glass of wine (148 mL), or one 1 oz glass of hard liquor (44 mL). General instructions  Take over-the-counter and prescription medicines only as told by your health care provider.  Keep all follow-up visits. This is important. This includes having regularly scheduled colonoscopies. Talk to your health care provider about when you need a colonoscopy. Contact a health care provider if:  You have new or worsening bleeding  during a bowel movement.  You have new or increased blood in your stool.  You have a change in bowel habits.  You lose weight for no known reason. Summary  Colon polyps are tissue growths inside the colon, which is part of the large intestine. They are one type of polyp that can grow in the body.  Most colon polyps are noncancerous (benign), but some can become cancerous over time.  This condition is diagnosed with a colonoscopy.  This condition is treated by removing any polyps that are found. Most polyps can be removed during a colonoscopy. This information is not intended to replace advice given to you by your health care provider. Make sure you discuss any questions you have with your health care provider. Document Revised: 12/21/2019 Document Reviewed: 12/21/2019 Elsevier Patient Education  2021 [employer].

## 2020-11-05 NOTE — Progress Notes (Signed)
Patient states she has a question for Dr. Abbey Chatters.  "I have been working on a new job for three months now, and two different times I have had sharp rectal pain, Can that be related to hemorrhoids". Dr. Abbey Chatters notified of patient question. Dr. Abbey Chatters to speak to patient prior to discharge.

## 2020-11-06 LAB — SURGICAL PATHOLOGY

## 2020-11-08 ENCOUNTER — Encounter (HOSPITAL_COMMUNITY): Payer: Self-pay | Admitting: Internal Medicine

## 2020-11-13 ENCOUNTER — Telehealth: Payer: Self-pay | Admitting: *Deleted

## 2020-11-13 DIAGNOSIS — M722 Plantar fascial fibromatosis: Secondary | ICD-10-CM | POA: Diagnosis not present

## 2020-11-13 NOTE — Telephone Encounter (Signed)
Pt left VM message stating that she would like to schedule appointment with Dr. Sabra Heck. She is a patient from her previous practice and does not know her new location. Pt also stated that she needs Rx refill for one year and only has one refill remaining. She did not state name of medication.

## 2020-11-14 ENCOUNTER — Other Ambulatory Visit (HOSPITAL_BASED_OUTPATIENT_CLINIC_OR_DEPARTMENT_OTHER): Payer: Self-pay | Admitting: Obstetrics & Gynecology

## 2020-11-14 MED ORDER — VALACYCLOVIR HCL 500 MG PO TABS: 500.0000 mg | ORAL_TABLET | Freq: Every day | ORAL | 1 refills | Status: DC

## 2020-11-14 NOTE — Telephone Encounter (Signed)
Could you call this patient and schedule her for an AEX.  I've already done the refill for the Valcyclovir for her so just let her know the prescription has been completed.  Thanks.

## 2020-11-15 NOTE — Telephone Encounter (Signed)
Called and left patient  a message to please call the office to schedule her appointment with Dr.Miller.

## 2020-11-23 ENCOUNTER — Ambulatory Visit: Payer: BC Managed Care – PPO

## 2020-12-14 ENCOUNTER — Ambulatory Visit: Payer: BC Managed Care – PPO | Admitting: Gastroenterology

## 2020-12-14 ENCOUNTER — Encounter: Payer: Self-pay | Admitting: Internal Medicine

## 2020-12-24 ENCOUNTER — Ambulatory Visit (HOSPITAL_BASED_OUTPATIENT_CLINIC_OR_DEPARTMENT_OTHER): Payer: BC Managed Care – PPO | Admitting: Obstetrics & Gynecology

## 2021-04-02 ENCOUNTER — Ambulatory Visit (HOSPITAL_BASED_OUTPATIENT_CLINIC_OR_DEPARTMENT_OTHER): Payer: BC Managed Care – PPO | Admitting: Obstetrics & Gynecology

## 2021-04-16 DIAGNOSIS — M722 Plantar fascial fibromatosis: Secondary | ICD-10-CM | POA: Diagnosis not present

## 2021-04-16 DIAGNOSIS — M79671 Pain in right foot: Secondary | ICD-10-CM | POA: Diagnosis not present

## 2021-05-07 DIAGNOSIS — Z1231 Encounter for screening mammogram for malignant neoplasm of breast: Secondary | ICD-10-CM | POA: Diagnosis not present

## 2021-05-14 ENCOUNTER — Encounter (HOSPITAL_BASED_OUTPATIENT_CLINIC_OR_DEPARTMENT_OTHER): Payer: Self-pay | Admitting: Obstetrics & Gynecology

## 2021-05-17 ENCOUNTER — Ambulatory Visit (HOSPITAL_BASED_OUTPATIENT_CLINIC_OR_DEPARTMENT_OTHER): Payer: BC Managed Care – PPO | Admitting: Obstetrics & Gynecology

## 2021-07-24 ENCOUNTER — Other Ambulatory Visit (HOSPITAL_COMMUNITY)
Admission: RE | Admit: 2021-07-24 | Discharge: 2021-07-24 | Disposition: A | Discharge status: Home / Self Care | Payer: BC Managed Care – PPO | Source: Ambulatory Visit | Attending: Obstetrics & Gynecology | Admitting: Obstetrics & Gynecology

## 2021-07-24 ENCOUNTER — Ambulatory Visit (INDEPENDENT_AMBULATORY_CARE_PROVIDER_SITE_OTHER): Payer: BC Managed Care – PPO | Admitting: Obstetrics & Gynecology

## 2021-07-24 ENCOUNTER — Encounter (HOSPITAL_BASED_OUTPATIENT_CLINIC_OR_DEPARTMENT_OTHER): Payer: Self-pay | Admitting: Obstetrics & Gynecology

## 2021-07-24 ENCOUNTER — Other Ambulatory Visit: Payer: Self-pay

## 2021-07-24 VITALS — BP 125/71 | HR 83 | Ht 60.5 in | Wt 148.6 lb

## 2021-07-24 DIAGNOSIS — Z0000 Encounter for general adult medical examination without abnormal findings: Secondary | ICD-10-CM

## 2021-07-24 DIAGNOSIS — E559 Vitamin D deficiency, unspecified: Secondary | ICD-10-CM

## 2021-07-24 DIAGNOSIS — Z01419 Encounter for gynecological examination (general) (routine) without abnormal findings: Secondary | ICD-10-CM

## 2021-07-24 DIAGNOSIS — Z113 Encounter for screening for infections with a predominantly sexual mode of transmission: Secondary | ICD-10-CM

## 2021-07-24 DIAGNOSIS — R232 Flushing: Secondary | ICD-10-CM | POA: Diagnosis not present

## 2021-07-24 DIAGNOSIS — B009 Herpesviral infection, unspecified: Secondary | ICD-10-CM | POA: Diagnosis not present

## 2021-07-24 DIAGNOSIS — N898 Other specified noninflammatory disorders of vagina: Secondary | ICD-10-CM | POA: Diagnosis not present

## 2021-07-24 DIAGNOSIS — Z124 Encounter for screening for malignant neoplasm of cervix: Secondary | ICD-10-CM | POA: Insufficient documentation

## 2021-07-24 MED ORDER — VALACYCLOVIR HCL 500 MG PO TABS: 500.0000 mg | ORAL_TABLET | Freq: Every day | ORAL | 4 refills | Status: DC

## 2021-07-24 NOTE — Progress Notes (Signed)
50 y.o. G31P2002 Married White or Caucasian female here for annual exam.  She is having some vaginal odor after intercourse.  Denies vaginal bleeding.  Husband had an affair.  They are trying to work things out.    Patient's last menstrual period was 08/18/2011.          Sexually active: Yes.    The current method of family planning is hysterectomy Smoker:  no  Health Maintenance: Pap:  09/13/2019 Negative History of abnormal Pap:  yes MMG:  05/07/21 Negative Colonoscopy:  11/05/2020, hyperplastic polyp.  Follow up 5 years Screening Labs: ordered   reports that she has never smoked. She has never used smokeless tobacco. She reports current alcohol use. She reports that she does not use drugs.  Past Medical History:  Diagnosis Date   Abnormal Pap smear    GERD (gastroesophageal reflux disease)    Headache(784.0)    Hiatal hernia    HSV (herpes simplex virus) infection     Past Surgical History:  Procedure Laterality Date   COLONOSCOPY WITH PROPOFOL N/A 11/05/2020   Procedure: COLONOSCOPY WITH PROPOFOL;  Surgeon: Eloise Harman, DO;  [facility];  Service: Endoscopy;  Laterality: N/A;  ASA II/ 9:15   CRYOTHERAPY  1993   cervix   CYSTOSCOPY  09/02/2011   Procedure: CYSTOSCOPY;  Surgeon: Felipa Emory;  Location: [facility];  Service: Gynecology;  Laterality: N/A;   LAPAROSCOPIC ASSISTED VAGINAL HYSTERECTOMY  09/02/2011   Robotic/MSM    LEEP  2005   POLYPECTOMY  11/05/2020   Procedure: POLYPECTOMY INTESTINAL;  Surgeon: Eloise Harman, DO;  [facility];  Service: Endoscopy;;  tranvserse colon polyp;     Current Outpatient Medications  Medication Sig Dispense Refill   aspirin EC 81 MG tablet Take 81 mg by mouth daily. Swallow whole.     Ibuprofen 200 MG CAPS Take 600 mg by mouth daily as needed (pain and headache).     Magnesium 250 MG TABS Take 250 mg by mouth daily.     omeprazole (PRILOSEC) 40 MG capsule TAKE 1 CAPSULE BY MOUTH EVERY DAY (Patient  taking differently: Take 20-40 mg by mouth daily as needed (Heartburn).) 90 capsule 0   valACYclovir (VALTREX) 500 MG tablet Take 1 tablet (500 mg total) by mouth daily. 90 tablet 1   vitamin B-12 (CYANOCOBALAMIN) 1000 MCG tablet Take 1,000 mcg by mouth daily.     No current facility-administered medications for this visit.    Family History  Problem Relation Age of Onset   Colon cancer Father 62   Diabetes Father    Breast cancer Maternal Aunt 45   Breast cancer Maternal Aunt 45    Review of Systems  All other systems reviewed and are negative.  Exam:   BP 125/71 (BP Location: Left Arm, Patient Position: Sitting, Cuff Size: Normal)   Pulse 83   Ht 5' 0.5" (1.537 m)   Wt 148 lb 9.6 oz (67.4 kg)   LMP 08/18/2011   BMI 28.54 kg/m   Height: 5' 0.5" (153.7 cm)  General appearance: alert, cooperative and appears stated age Head: Normocephalic, without obvious abnormality, atraumatic Neck: no adenopathy, supple, symmetrical, trachea midline and thyroid normal to inspection and palpation Lungs: clear to auscultation bilaterally Breasts: normal appearance, no masses or tenderness Heart: regular rate and rhythm Abdomen: soft, non-tender; bowel sounds normal; no masses,  no organomegaly Extremities: extremities normal, atraumatic, no cyanosis or edema Skin: Skin color, texture, turgor normal. No rashes or  lesions Lymph nodes: Cervical, supraclavicular, and axillary nodes normal. No abnormal inguinal nodes palpated Neurologic: Grossly normal   Pelvic: External genitalia:  no lesions              Urethra:  normal appearing urethra with no masses, tenderness or lesions              Bartholins and Skenes: normal                 Vagina: normal appearing vagina with normal color and no discharge, no lesions              Cervix: absent              Pap taken: Yes.   Bimanual Exam:  Uterus:  uterus absent              Adnexa: no mass, fullness, tenderness               Rectovaginal:  Confirms               Anus:  normal sphincter tone, no lesions  Chaperone, Octaviano Batty, CMA, was present for exam.  Assessment/Plan: 1. Well woman exam with routine gynecological exam - pap and HR HPV obtained today - colonoscopy  2.21/2022.  Follow up 5 years due to family history - MMG 05/07/2021 - care gaps/vaccines reviewed/updated  2. Screening examination for STD (sexually transmitted disease) - Cervicovaginal ancillary only( [place]) - RPR+HBsAg+HIV - Hepatitis C antibody - Cytology - PAP( [place])  3. Vaginal odor - Cervicovaginal ancillary only( [place])  4. Blood tests for routine general physical examination - Lipid panel - CBC - Comprehensive metabolic panel - TSH  5. Vitamin D deficiency - VITAMIN D 25 Hydroxy (Vit-D Deficiency, Fractures)  6. Hot flashes - Follicle stimulating hormone  7. HSV - RF for valtrex to pharmacy

## 2021-07-25 DIAGNOSIS — B009 Herpesviral infection, unspecified: Secondary | ICD-10-CM | POA: Insufficient documentation

## 2021-07-25 DIAGNOSIS — R232 Flushing: Secondary | ICD-10-CM | POA: Insufficient documentation

## 2021-07-25 LAB — COMPREHENSIVE METABOLIC PANEL
ALT: 21 IU/L (ref 0–32)
AST: 18 IU/L (ref 0–40)
Albumin/Globulin Ratio: 1.9 (ref 1.2–2.2)
Albumin: 4.7 g/dL (ref 3.8–4.8)
Alkaline Phosphatase: 84 IU/L (ref 44–121)
BUN/Creatinine Ratio: 23 (ref 9–23)
BUN: 12 mg/dL (ref 6–24)
Bilirubin Total: 0.3 mg/dL (ref 0.0–1.2)
CO2: 25 mmol/L (ref 20–29)
Calcium: 9.2 mg/dL (ref 8.7–10.2)
Chloride: 101 mmol/L (ref 96–106)
Creatinine, Ser: 0.53 mg/dL — ABNORMAL LOW (ref 0.57–1.00)
Globulin, Total: 2.5 g/dL (ref 1.5–4.5)
Glucose: 90 mg/dL (ref 70–99)
Potassium: 4.8 mmol/L (ref 3.5–5.2)
Sodium: 138 mmol/L (ref 134–144)
Total Protein: 7.2 g/dL (ref 6.0–8.5)
eGFR: 113 mL/min/{1.73_m2} (ref >59)

## 2021-07-25 LAB — CBC
Hematocrit: 42 % (ref 34.0–46.6)
Hemoglobin: 13.8 g/dL (ref 11.1–15.9)
MCH: 29.5 pg (ref 26.6–33.0)
MCHC: 32.9 g/dL (ref 31.5–35.7)
MCV: 90 fL (ref 79–97)
Platelets: 292 10*3/uL (ref 150–450)
RBC: 4.68 x10E6/uL (ref 3.77–5.28)
RDW: 12.6 % (ref 11.7–15.4)
WBC: 10.2 10*3/uL (ref 3.4–10.8)

## 2021-07-25 LAB — CERVICOVAGINAL ANCILLARY ONLY
Candida Glabrata: NEGATIVE
Candida Vaginitis: NEGATIVE
Chlamydia: NEGATIVE
Comment: NEGATIVE
Comment: NEGATIVE
Comment: NEGATIVE
Comment: NEGATIVE
Comment: NORMAL
Neisseria Gonorrhea: NEGATIVE
Trichomonas: NEGATIVE

## 2021-07-25 LAB — LIPID PANEL
Chol/HDL Ratio: 2.4 ratio (ref 0.0–4.4)
Cholesterol, Total: 212 mg/dL — ABNORMAL HIGH (ref 100–199)
HDL: 89 mg/dL (ref >39)
LDL Chol Calc (NIH): 105 mg/dL — ABNORMAL HIGH (ref 0–99)
Triglycerides: 102 mg/dL (ref 0–149)
VLDL Cholesterol Cal: 18 mg/dL (ref 5–40)

## 2021-07-25 LAB — RPR+HBSAG+HIV
HIV Screen 4th Generation wRfx: NONREACTIVE
Hepatitis B Surface Ag: NEGATIVE
RPR Ser Ql: NONREACTIVE

## 2021-07-25 LAB — CYTOLOGY - PAP
Comment: NEGATIVE
Diagnosis: NEGATIVE
High risk HPV: NEGATIVE

## 2021-07-25 LAB — TSH: TSH: 2.87 u[IU]/mL (ref 0.450–4.500)

## 2021-07-25 LAB — FOLLICLE STIMULATING HORMONE: FSH: 10 m[IU]/mL

## 2021-07-25 LAB — HEPATITIS C ANTIBODY: Hep C Virus Ab: <0.1 s/co ratio (ref 0.0–0.9)

## 2021-07-25 LAB — VITAMIN D 25 HYDROXY (VIT D DEFICIENCY, FRACTURES): Vit D, 25-Hydroxy: 34.5 ng/mL (ref 30.0–100.0)

## 2021-07-31 ENCOUNTER — Ambulatory Visit (HOSPITAL_BASED_OUTPATIENT_CLINIC_OR_DEPARTMENT_OTHER): Payer: BC Managed Care – PPO | Admitting: Obstetrics & Gynecology

## 2021-08-20 DIAGNOSIS — R0981 Nasal congestion: Secondary | ICD-10-CM | POA: Diagnosis not present

## 2021-08-20 DIAGNOSIS — J0120 Acute ethmoidal sinusitis, unspecified: Secondary | ICD-10-CM | POA: Diagnosis not present

## 2021-08-20 DIAGNOSIS — G441 Vascular headache, not elsewhere classified: Secondary | ICD-10-CM | POA: Diagnosis not present

## 2022-01-20 DIAGNOSIS — M545 Low back pain, unspecified: Secondary | ICD-10-CM | POA: Diagnosis not present

## 2022-05-15 DIAGNOSIS — Z1231 Encounter for screening mammogram for malignant neoplasm of breast: Secondary | ICD-10-CM | POA: Diagnosis not present

## 2022-05-21 DIAGNOSIS — L719 Rosacea, unspecified: Secondary | ICD-10-CM | POA: Diagnosis not present

## 2022-06-24 DIAGNOSIS — R1310 Dysphagia, unspecified: Secondary | ICD-10-CM | POA: Diagnosis not present

## 2022-06-24 DIAGNOSIS — Z0001 Encounter for general adult medical examination with abnormal findings: Secondary | ICD-10-CM | POA: Diagnosis not present

## 2022-06-24 DIAGNOSIS — E663 Overweight: Secondary | ICD-10-CM | POA: Diagnosis not present

## 2022-06-24 DIAGNOSIS — Z6829 Body mass index (BMI) 29.0-29.9, adult: Secondary | ICD-10-CM | POA: Diagnosis not present

## 2022-06-24 DIAGNOSIS — K219 Gastro-esophageal reflux disease without esophagitis: Secondary | ICD-10-CM | POA: Diagnosis not present

## 2022-06-24 DIAGNOSIS — Z1331 Encounter for screening for depression: Secondary | ICD-10-CM | POA: Diagnosis not present

## 2022-07-02 ENCOUNTER — Encounter: Payer: Self-pay | Admitting: *Deleted

## 2022-08-13 ENCOUNTER — Encounter: Payer: Self-pay | Admitting: Internal Medicine

## 2022-08-13 ENCOUNTER — Encounter: Payer: Self-pay | Admitting: *Deleted

## 2022-08-13 ENCOUNTER — Ambulatory Visit (INDEPENDENT_AMBULATORY_CARE_PROVIDER_SITE_OTHER): Payer: BC Managed Care – PPO | Admitting: Internal Medicine

## 2022-08-13 VITALS — BP 107/63 | HR 86 | Temp 98.4°F | Ht 60.0 in | Wt 152.7 lb

## 2022-08-13 DIAGNOSIS — Z800 Family history of malignant neoplasm of digestive organs: Secondary | ICD-10-CM

## 2022-08-13 DIAGNOSIS — R1319 Other dysphagia: Secondary | ICD-10-CM

## 2022-08-13 DIAGNOSIS — K449 Diaphragmatic hernia without obstruction or gangrene: Secondary | ICD-10-CM | POA: Diagnosis not present

## 2022-08-13 DIAGNOSIS — R142 Eructation: Secondary | ICD-10-CM | POA: Diagnosis not present

## 2022-08-13 DIAGNOSIS — K219 Gastro-esophageal reflux disease without esophagitis: Secondary | ICD-10-CM | POA: Diagnosis not present

## 2022-08-13 MED ORDER — PANTOPRAZOLE SODIUM 40 MG PO TBEC
40.0000 mg | DELAYED_RELEASE_TABLET | Freq: Every day | ORAL | 11 refills | Status: DC
Start: 2022-08-13 — End: 2022-12-26

## 2022-08-13 NOTE — Progress Notes (Signed)
Primary Care Physician:  Sharilyn Sites, MD Primary Gastroenterologist:  Dr. Abbey Chatters  Chief Complaint  Patient presents with   Dysphagia    Has trouble swallowing, a lot of belching and pain when swallowing. Has hiatal hernia. Tried pantoprazole and it helped but ran out of refills about 2 -3 weeks ago.     HPI:   Carmen Robles is a 51 y.o. female who presents clinic today by referral from her PCP Dr. Hilma Robles.    States she has had chronic reflux for a long time though recently her symptoms has been progressively worsening.  Notes acid reflux as well as belching.  Symptoms mild to moderate, intermittent in nature. Worsened depending on what she eats. Was started on pantoprazole 40 mg daily which was helping though she states she ran out of this medication 2 to 3 weeks ago.  Also with progressively worsening dysphagia.  States food and liquids will get stuck in her substernal region.  No previous upper endoscopy.  Did have a CT abdomen pelvis 2009 which showed a small hiatal hernia.  Family history of colon cancer in father.  Last colonoscopy February 2022 with 1 hyperplastic polyp removed.  Recommended recall 5 years.  Past Medical History:  Diagnosis Date   Abnormal Pap smear    GERD (gastroesophageal reflux disease)    Headache(784.0)    Hiatal hernia    HSV (herpes simplex virus) infection     Past Surgical History:  Procedure Laterality Date   COLONOSCOPY WITH PROPOFOL N/A 11/05/2020   Procedure: COLONOSCOPY WITH PROPOFOL;  Surgeon: Eloise Harman, DO;  [facility];  Service: Endoscopy;  Laterality: N/A;  ASA II/ 9:15   CRYOTHERAPY  1993   cervix   CYSTOSCOPY  09/02/2011   Procedure: CYSTOSCOPY;  Surgeon: Felipa Emory;  Location: [facility];  Service: Gynecology;  Laterality: N/A;   LAPAROSCOPIC ASSISTED VAGINAL HYSTERECTOMY  09/02/2011   Robotic/MSM    LEEP  2005   POLYPECTOMY  11/05/2020   Procedure: POLYPECTOMY INTESTINAL;  Surgeon: Eloise Harman, DO;  [facility];  Service: Endoscopy;;  tranvserse colon polyp;     Current Outpatient Medications  Medication Sig Dispense Refill   Ibuprofen 200 MG CAPS Take 600 mg by mouth daily as needed (pain and headache).     pantoprazole (PROTONIX) 40 MG tablet Take 40 mg by mouth daily.     valACYclovir (VALTREX) 500 MG tablet Take 1 tablet (500 mg total) by mouth daily. 90 tablet 4   No current facility-administered medications for this visit.    Allergies as of 08/13/2022 - Review Complete 08/13/2022  Allergen Reaction Noted   Codeine Nausea And Vomiting 08/28/2012    Family History  Problem Relation Age of Onset   Colon cancer Father 32   Diabetes Father    Breast cancer Maternal Aunt 45   Breast cancer Maternal Aunt 37    Social History   Socioeconomic History   Marital status: Married    Spouse name: Not on file   Number of children: Not on file   Years of education: Not on file   Highest education level: Not on file  Occupational History   Not on file  Tobacco Use   Smoking status: Never    Passive exposure: Never   Smokeless tobacco: Never  Vaping Use   Vaping Use: Never used  Substance and Sexual Activity   Alcohol use: Yes    Alcohol/week: 0.0 - 1.0 standard  drinks of alcohol    Comment: occassionally   Drug use: No   Sexual activity: Yes    Partners: Male    Birth control/protection: Surgical    Comment: hysterectomy,vasectomy  Other Topics Concern   Not on file  Social History Narrative   Not on file   Social Determinants of Health   Financial Resource Strain: Not on file  Food Insecurity: Not on file  Transportation Needs: Not on file  Physical Activity: Not on file  Stress: Not on file  Social Connections: Not on file  Intimate Partner Violence: Not on file    Subjective: Review of Systems  Constitutional:  Negative for chills and fever.  HENT:  Negative for congestion and hearing loss.   Eyes:  Negative for blurred vision  and double vision.  Respiratory:  Negative for cough and shortness of breath.   Cardiovascular:  Negative for chest pain and palpitations.  Gastrointestinal:  Positive for heartburn. Negative for abdominal pain, blood in stool, constipation, diarrhea, melena and vomiting.       Belching, dysphagia  Genitourinary:  Negative for dysuria and urgency.  Musculoskeletal:  Negative for joint pain and myalgias.  Skin:  Negative for itching and rash.  Neurological:  Negative for dizziness and headaches.  Psychiatric/Behavioral:  Negative for depression. The patient is not nervous/anxious.        Objective: BP 107/63 (BP Location: Left Arm, Patient Position: Sitting, Cuff Size: Large)   Pulse 86   Temp 98.4 F (36.9 C) (Oral)   Ht 5' (1.524 m)   Wt 152 lb 11.2 oz (69.3 kg)   LMP 08/18/2011   BMI 29.82 kg/m  Physical Exam Constitutional:      Appearance: Normal appearance.  HENT:     Head: Normocephalic and atraumatic.  Eyes:     Extraocular Movements: Extraocular movements intact.     Conjunctiva/sclera: Conjunctivae normal.  Cardiovascular:     Rate and Rhythm: Normal rate and regular rhythm.  Pulmonary:     Effort: Pulmonary effort is normal.     Breath sounds: Normal breath sounds.  Abdominal:     General: Bowel sounds are normal.     Palpations: Abdomen is soft.  Musculoskeletal:        General: No swelling. Normal range of motion.     Cervical back: Normal range of motion and neck supple.  Skin:    General: Skin is warm and dry.     Coloration: Skin is not jaundiced.  Neurological:     General: No focal deficit present.     Mental Status: She is alert and oriented to person, place, and time.  Psychiatric:        Mood and Affect: Mood normal.        Behavior: Behavior normal.      Assessment: *Chronic GERD *Esophageal dysphagia *Hiatal hernia *Family history of colon cancer in father   Plan: Will schedule for EGD with possible dilation to evaluate for peptic  ulcer disease, esophagitis, gastritis, H. Pylori, duodenitis, or other. Will also evaluate for esophageal stricture, Schatzki's ring, esophageal web or other.   The risks including infection, bleed, or perforation as well as benefits, limitations, alternatives and imponderables have been reviewed with the patient. Potential for esophageal dilation, biopsy, etc. have also been reviewed.  Questions have been answered. All parties agreeable.  Will refill pantoprazole 40 mg daily today. May make adjustments pending endoscopic evaluation  Colonoscopy recall 2027 given family history of colon cancer.   08/13/2022  1:30 PM   Disclaimer: This note was dictated with voice recognition software. Similar sounding words can inadvertently be transcribed and may not be corrected upon review.

## 2022-08-13 NOTE — Patient Instructions (Signed)
We will schedule you for upper endoscopy to further evaluate your chronic reflux, feeling of food getting stuck in your substernal region, hiatal hernia.  I may elect to stretch your esophagus depending on findings.  Continue on pantoprazole daily.  I sent in refills today.  We will plan on repeat colonoscopy in 2027 given your family history of colon cancer.  It was very nice seeing you again today.  Dr. Abbey Chatters

## 2022-09-01 ENCOUNTER — Telehealth: Payer: Self-pay | Admitting: *Deleted

## 2022-09-01 NOTE — Telephone Encounter (Signed)
Pt called to cancel her procedure for 09/05/22 with Dr.Carver. She said she needs end of January or February. She will check her calendar to see if 10/14/22 is a good date and will call back. If needs February, will have to have a call back once we get providers schedule.

## 2022-09-03 ENCOUNTER — Encounter (HOSPITAL_COMMUNITY): Payer: BC Managed Care – PPO

## 2022-09-05 ENCOUNTER — Encounter (HOSPITAL_COMMUNITY): Admission: RE | Payer: Self-pay | Source: Ambulatory Visit

## 2022-09-05 ENCOUNTER — Ambulatory Visit (HOSPITAL_COMMUNITY): Admission: RE | Admit: 2022-09-05 | Payer: BC Managed Care – PPO | Source: Ambulatory Visit

## 2022-09-05 SURGERY — ESOPHAGOGASTRODUODENOSCOPY (EGD) WITH PROPOFOL
Anesthesia: Monitor Anesthesia Care

## 2022-09-11 ENCOUNTER — Encounter (HOSPITAL_BASED_OUTPATIENT_CLINIC_OR_DEPARTMENT_OTHER): Payer: Self-pay | Admitting: Obstetrics & Gynecology

## 2022-09-11 ENCOUNTER — Ambulatory Visit (INDEPENDENT_AMBULATORY_CARE_PROVIDER_SITE_OTHER): Payer: BC Managed Care – PPO | Admitting: Obstetrics & Gynecology

## 2022-09-11 VITALS — BP 119/63 | HR 77 | Ht 60.0 in | Wt 156.0 lb

## 2022-09-11 DIAGNOSIS — M5432 Sciatica, left side: Secondary | ICD-10-CM

## 2022-09-11 DIAGNOSIS — Z01419 Encounter for gynecological examination (general) (routine) without abnormal findings: Secondary | ICD-10-CM | POA: Diagnosis not present

## 2022-09-11 DIAGNOSIS — M545 Low back pain, unspecified: Secondary | ICD-10-CM | POA: Diagnosis not present

## 2022-09-11 DIAGNOSIS — Z90710 Acquired absence of both cervix and uterus: Secondary | ICD-10-CM

## 2022-09-11 DIAGNOSIS — B009 Herpesviral infection, unspecified: Secondary | ICD-10-CM | POA: Diagnosis not present

## 2022-09-11 DIAGNOSIS — L719 Rosacea, unspecified: Secondary | ICD-10-CM

## 2022-09-11 MED ORDER — VALACYCLOVIR HCL 500 MG PO TABS
500.0000 mg | ORAL_TABLET | Freq: Every day | ORAL | 4 refills | Status: DC
Start: 2022-09-11 — End: 2023-09-25

## 2022-09-11 NOTE — Progress Notes (Signed)
51 y.o. G7P2002 Married White or Caucasian female here for annual exam.  Doing well.  Holiday was nice.  Brother is in [place].  Considering going there next year for the holidays.  Denies vaginal bleeding.   Patient's last menstrual period was 08/18/2011.          Sexually active: No.  The current method of family planning is status post hysterectomy.    Smoker:  no  Health Maintenance: Pap:  07/24/2021 Negative History of abnormal Pap:  yes MMG:  05/15/2022 Negative Colonoscopy:  11/05/2020, follow up 5 years BMD:   guidelines reviewed Screening Labs: Dr. Hilma Favors   reports that she has never smoked. She has never been exposed to tobacco smoke. She has never used smokeless tobacco. She reports current alcohol use. She reports that she does not use drugs.  Past Medical History:  Diagnosis Date   Abnormal Pap smear    GERD (gastroesophageal reflux disease)    Headache(784.0)    Hiatal hernia    HSV (herpes simplex virus) infection     Past Surgical History:  Procedure Laterality Date   COLONOSCOPY WITH PROPOFOL N/A 11/05/2020   Procedure: COLONOSCOPY WITH PROPOFOL;  Surgeon: Eloise Harman, DO;  [facility]: [facility];  Service: Endoscopy;  Laterality: N/A;  ASA II/ 9:15   CRYOTHERAPY  1993   cervix   CYSTOSCOPY  09/02/2011   Procedure: CYSTOSCOPY;  Surgeon: Felipa Emory;  Location: [facility];  Service: Gynecology;  Laterality: N/A;   LAPAROSCOPIC ASSISTED VAGINAL HYSTERECTOMY  09/02/2011   Robotic/MSM    LEEP  2005   POLYPECTOMY  11/05/2020   Procedure: POLYPECTOMY INTESTINAL;  Surgeon: Eloise Harman, DO;  Location: [facility];  Service: Endoscopy;;  tranvserse colon polyp;     Current Outpatient Medications  Medication Sig Dispense Refill   Ibuprofen 200 MG CAPS Take 600 mg by mouth daily as needed (pain and headache).     pantoprazole (PROTONIX) 40 MG tablet Take 1 tablet (40 mg total) by mouth daily. 30 tablet 11   valACYclovir (VALTREX) 500 MG tablet Take 1  tablet (500 mg total) by mouth daily. 90 tablet 4   No current facility-administered medications for this visit.    Family History  Problem Relation Age of Onset   Colon cancer Father 3   Diabetes Father    Breast cancer Maternal Aunt 45   Breast cancer Maternal Aunt 45    ROS: Constitutional: negative Genitourinary:negative  Exam:   BP 119/63 (BP Location: Right Arm, Patient Position: Sitting, Cuff Size: Large)   Pulse 77   Ht 5' (1.524 m) Comment: Reported  Wt 156 lb (70.8 kg)   LMP 08/18/2011   BMI 30.47 kg/m   Height: 5' (152.4 cm) (Reported)  General appearance: alert, cooperative and appears stated age Head: Normocephalic, without obvious abnormality, atraumatic Neck: no adenopathy, supple, symmetrical, trachea midline and thyroid normal to inspection and palpation Lungs: clear to auscultation bilaterally Breasts: normal appearance, no masses or tenderness Heart: regular rate and rhythm Abdomen: soft, non-tender; bowel sounds normal; no masses,  no organomegaly Extremities: extremities normal, atraumatic, no cyanosis or edema Skin: Skin color, texture, turgor normal. No rashes or lesions Lymph nodes: Cervical, supraclavicular, and axillary nodes normal. No abnormal inguinal nodes palpated Neurologic: Grossly normal   Pelvic: External genitalia:  no lesions              Urethra:  normal appearing urethra with no masses, tenderness or lesions  Bartholins and Skenes: normal                 Vagina: normal appearing vagina with normal color and no discharge, no lesions              Cervix: absent              Pap taken: No. Bimanual Exam:  Uterus:  uterus absent              Adnexa: no mass, fullness, tenderness               Rectovaginal: Confirms               Anus:  normal sphincter tone, no lesions  Chaperone, Octaviano Batty, CMA, was present for exam.  Assessment/Plan: 1. Well woman exam with routine gynecological exam - Pap smear not indicated.   Negative with neg HR HPV 2022. - Mammogram 04/2022 - Colonoscopy 2/202 - Bone mineral density guidelines reviewed - lab work done with PCP, Dr. Hilma Favors - vaccines reviewed/updated  2. HSV infection - valACYclovir (VALTREX) 500 MG tablet; Take 1 tablet (500 mg total) by mouth daily.  Dispense: 90 tablet; Refill: 4  3. Rosacea  4. Sciatica of left side - currently starting steroids  5. H/O: hysterectomy

## 2022-10-17 DIAGNOSIS — M545 Low back pain, unspecified: Secondary | ICD-10-CM | POA: Diagnosis not present

## 2022-10-17 DIAGNOSIS — M5432 Sciatica, left side: Secondary | ICD-10-CM | POA: Diagnosis not present

## 2022-10-27 ENCOUNTER — Telehealth: Payer: Self-pay | Admitting: *Deleted

## 2022-10-27 NOTE — Telephone Encounter (Signed)
Pt called to reschedule  her procedure from 09/05/22 with Dr.Carver. She said she needed a Friday and the Friday I offered she couldn't do and couldn't do the other days I offered. She wants  April, will have to have a call back once we get providers schedule. Pt verbalized understanding.

## 2022-11-24 NOTE — Telephone Encounter (Signed)
Called pt. Rescheduled to 4/12 at 8am. Aware will send instructions via mychart.

## 2022-12-26 ENCOUNTER — Ambulatory Visit (HOSPITAL_COMMUNITY)
Admission: RE | Admit: 2022-12-26 | Discharge: 2022-12-26 | Disposition: A | Discharge status: Home / Self Care | Payer: BC Managed Care – PPO | Attending: Internal Medicine | Admitting: Internal Medicine

## 2022-12-26 ENCOUNTER — Ambulatory Visit (HOSPITAL_COMMUNITY): Payer: BC Managed Care – PPO | Admitting: Anesthesiology

## 2022-12-26 ENCOUNTER — Other Ambulatory Visit: Payer: Self-pay

## 2022-12-26 ENCOUNTER — Telehealth: Payer: Self-pay

## 2022-12-26 ENCOUNTER — Encounter (HOSPITAL_COMMUNITY)
Admission: RE | Disposition: A | Discharge status: Home / Self Care | Payer: Self-pay | Source: Home / Self Care | Attending: Internal Medicine

## 2022-12-26 ENCOUNTER — Encounter (HOSPITAL_COMMUNITY): Payer: Self-pay

## 2022-12-26 DIAGNOSIS — K222 Esophageal obstruction: Secondary | ICD-10-CM | POA: Diagnosis not present

## 2022-12-26 DIAGNOSIS — R1310 Dysphagia, unspecified: Secondary | ICD-10-CM | POA: Insufficient documentation

## 2022-12-26 DIAGNOSIS — K219 Gastro-esophageal reflux disease without esophagitis: Secondary | ICD-10-CM | POA: Insufficient documentation

## 2022-12-26 DIAGNOSIS — K449 Diaphragmatic hernia without obstruction or gangrene: Secondary | ICD-10-CM | POA: Diagnosis not present

## 2022-12-26 DIAGNOSIS — K2970 Gastritis, unspecified, without bleeding: Secondary | ICD-10-CM

## 2022-12-26 DIAGNOSIS — G709 Myoneural disorder, unspecified: Secondary | ICD-10-CM | POA: Insufficient documentation

## 2022-12-26 HISTORY — PX: ESOPHAGOGASTRODUODENOSCOPY (EGD) WITH PROPOFOL: SHX5813

## 2022-12-26 HISTORY — PX: BALLOON DILATION: SHX5330

## 2022-12-26 HISTORY — PX: BIOPSY: SHX5522

## 2022-12-26 SURGERY — ESOPHAGOGASTRODUODENOSCOPY (EGD) WITH PROPOFOL
Anesthesia: General

## 2022-12-26 MED ORDER — OMEPRAZOLE 40 MG PO CPDR: 40.0000 mg | DELAYED_RELEASE_CAPSULE | Freq: Two times a day (BID) | ORAL | 11 refills | Status: AC

## 2022-12-26 MED ORDER — LACTATED RINGERS IV SOLN: INTRAVENOUS | Status: DC | PRN

## 2022-12-26 MED ORDER — LIDOCAINE HCL 1 % IJ SOLN
INTRAMUSCULAR | Status: DC | PRN
  Administered 2022-12-26: 50 mg via INTRADERMAL

## 2022-12-26 MED ORDER — LACTATED RINGERS IV SOLN: INTRAVENOUS | Status: DC

## 2022-12-26 MED ORDER — PROPOFOL 10 MG/ML IV BOLUS
INTRAVENOUS | Status: DC | PRN
  Administered 2022-12-26: 100 mg via INTRAVENOUS
  Administered 2022-12-26: 50 mg via INTRAVENOUS
  Administered 2022-12-26: 40 mg via INTRAVENOUS
  Administered 2022-12-26: 10 mg via INTRAVENOUS
  Administered 2022-12-26: 20 mg via INTRAVENOUS

## 2022-12-26 NOTE — Transfer of Care (Signed)
Immediate Anesthesia Transfer of Care Note  Patient: Carmen Robles  Procedure(s) Performed: ESOPHAGOGASTRODUODENOSCOPY (EGD) WITH PROPOFOL BALLOON DILATION BIOPSY  Patient Location: Endoscopy Unit  Anesthesia Type:General  Level of Consciousness: awake  Airway & Oxygen Therapy: Patient Spontanous Breathing  Post-op Assessment: Report given to RN  Post vital signs: Reviewed and stable  Last Vitals:  Vitals Value Taken Time  BP    Temp    Pulse    Resp    SpO2      Last Pain:  Vitals:   12/26/22 0639  TempSrc: Oral  PainSc: 0-No pain      Patients Stated Pain Goal: 8 (12/26/22 6295)  Complications: No notable events documented.

## 2022-12-26 NOTE — Telephone Encounter (Signed)
Pt has been approved for Omeprazole 40 mg DR caps. Notified the pt. Randel Books paperwork to scan to chart

## 2022-12-26 NOTE — H&P (Signed)
Primary Care Physician:  Assunta Found, MD Primary Gastroenterologist:  Dr. Marletta Lor  Pre-Procedure History & Physical: HPI:  Carmen Robles is a 52 y.o. female is here for an EGD with possible dilation to be performed for GERD and dysphagia  Past Medical History:  Diagnosis Date   Abnormal Pap smear    GERD (gastroesophageal reflux disease)    Headache(784.0)    Hiatal hernia    HSV (herpes simplex virus) infection     Past Surgical History:  Procedure Laterality Date   COLONOSCOPY WITH PROPOFOL N/A 11/05/2020   Procedure: COLONOSCOPY WITH PROPOFOL;  Surgeon: Lanelle Bal, DO;  [facility];  Service: Endoscopy;  Laterality: N/A;  ASA II/ 9:15   CRYOTHERAPY  1993   cervix   CYSTOSCOPY  09/02/2011   Procedure: CYSTOSCOPY;  Surgeon: Lum Keas;  Location: [facility];  Service: Gynecology;  Laterality: N/A;   LAPAROSCOPIC ASSISTED VAGINAL HYSTERECTOMY  09/02/2011   Robotic/MSM    LEEP  2005   POLYPECTOMY  11/05/2020   Procedure: POLYPECTOMY INTESTINAL;  Surgeon: Lanelle Bal, DO;  Location: [facility];  Service: Endoscopy;;  tranvserse colon polyp;     Prior to Admission medications   Medication Sig Start Date End Date Taking? Authorizing Provider  pantoprazole (PROTONIX) 40 MG tablet Take 1 tablet (40 mg total) by mouth daily. 08/13/22 08/13/23 Yes Emaya Preston, Hennie Duos, DO  valACYclovir (VALTREX) 500 MG tablet Take 1 tablet (500 mg total) by mouth daily. 09/11/22  Yes Jerene Bears, MD  Ibuprofen 200 MG CAPS Take 600 mg by mouth daily as needed (pain and headache).    [provider]    Allergies as of 11/24/2022 - Review Complete 09/11/2022  Allergen Reaction Noted   Codeine Nausea And Vomiting 08/28/2012    Family History  Problem Relation Age of Onset   Colon cancer Father 35   Diabetes Father    Breast cancer Maternal Aunt 45   Breast cancer Maternal Aunt 7    Social History   Socioeconomic History   Marital status: Married     Spouse name: Not on file   Number of children: Not on file   Years of education: Not on file   Highest education level: Not on file  Occupational History   Not on file  Tobacco Use   Smoking status: Never    Passive exposure: Never   Smokeless tobacco: Never  Vaping Use   Vaping Use: Never used  Substance and Sexual Activity   Alcohol use: Yes    Alcohol/week: 0.0 - 1.0 standard drinks of alcohol    Comment: occassionally   Drug use: No   Sexual activity: Yes    Partners: Male    Birth control/protection: Surgical    Comment: hysterectomy,vasectomy  Other Topics Concern   Not on file  Social History Narrative   Not on file   Social Determinants of Health   Financial Resource Strain: Not on file  Food Insecurity: Not on file  Transportation Needs: Not on file  Physical Activity: Not on file  Stress: Not on file  Social Connections: Not on file  Intimate Partner Violence: Not on file    Review of Systems: General: Negative for fever, chills, fatigue, weakness. Eyes: Negative for vision changes.  ENT: Negative for hoarseness, difficulty swallowing , nasal congestion. CV: Negative for chest pain, angina, palpitations, dyspnea on exertion, peripheral edema.  Respiratory: Negative for dyspnea at rest, dyspnea on exertion, cough, sputum, wheezing.  GI: See history of present illness. GU:  Negative for dysuria, hematuria, urinary incontinence, urinary frequency, nocturnal urination.  MS: Negative for joint pain, low back pain.  Derm: Negative for rash or itching.  Neuro: Negative for weakness, abnormal sensation, seizure, frequent headaches, memory loss, confusion.  Psych: Negative for anxiety, depression Endo: Negative for unusual weight change.  Heme: Negative for bruising or bleeding. Allergy: Negative for rash or hives.  Physical Exam: Vital signs in last 24 hours: Temp:  [98.2 F (36.8 C)] 98.2 F (36.8 C) (04/12 0639) Pulse Rate:  [76] 76 (04/12 0639) Resp:   [18] 18 (04/12 0639) BP: (113)/(70) 113/70 (04/12 0642) SpO2:  [98 %] 98 % (04/12 0639) Weight:  [68 kg] 68 kg (04/12 0639)   General:   Alert,  Well-developed, well-nourished, pleasant and cooperative in NAD Head:  Normocephalic and atraumatic. Eyes:  Sclera clear, no icterus.   Conjunctiva pink. Ears:  Normal auditory acuity. Nose:  No deformity, discharge,  or lesions. Msk:  Symmetrical without gross deformities. Normal posture. Extremities:  Without clubbing or edema. Neurologic:  Alert and  oriented x4;  grossly normal neurologically. Skin:  Intact without significant lesions or rashes. Psych:  Alert and cooperative. Normal mood and affect.   Impression/Plan: Carmen Robles is here for an EGD with possible dilation to be performed for GERD and dysphagia  Risks, benefits, limitations, imponderables and alternatives regarding EGD have been reviewed with the patient. Questions have been answered. All parties agreeable.

## 2022-12-26 NOTE — Discharge Instructions (Addendum)
EGD Discharge instructions Please read the instructions outlined below and refer to this sheet in the next few weeks. These discharge instructions provide you with general information on caring for yourself after you leave the hospital. Your doctor may also give you specific instructions. While your treatment has been planned according to the most current medical practices available, unavoidable complications occasionally occur. If you have any problems or questions after discharge, please call your doctor. ACTIVITY You may resume your regular activity but move at a slower pace for the next 24 hours.  Take frequent rest periods for the next 24 hours.  Walking will help expel (get rid of) the air and reduce the bloated feeling in your abdomen.  No driving for 24 hours (because of the anesthesia (medicine) used during the test).  You may shower.  Do not sign any important legal documents or operate any machinery for 24 hours (because of the anesthesia used during the test).  NUTRITION Drink plenty of fluids.  You may resume your normal diet.  Begin with a light meal and progress to your normal diet.  Avoid alcoholic beverages for 24 hours or as instructed by your caregiver.  MEDICATIONS You may resume your normal medications unless your caregiver tells you otherwise.  WHAT YOU CAN EXPECT TODAY You may experience abdominal discomfort such as a feeling of fullness or "gas" pains.  FOLLOW-UP Your doctor will discuss the results of your test with you.  SEEK IMMEDIATE MEDICAL ATTENTION IF ANY OF THE FOLLOWING OCCUR: Excessive nausea (feeling sick to your stomach) and/or vomiting.  Severe abdominal pain and distention (swelling).  Trouble swallowing.  Temperature over 101 F (37.8 C).  Rectal bleeding or vomiting of blood.   Your upper endoscopy revealed a medium size hiatal hernia.  You also had a tightening of your esophagus called a Schatzki's ring.  I stretched this out with a balloon today.   I then further disrupted it with forceps.  You also had mild amount inflammation of stomach which I took samples of to rule out bacterial infection with H. pylori.  Small bowel appeared normal.  I am going to change your pantoprazole to omeprazole 40 mg twice daily for the next 12 weeks at which point you can decrease down to once daily.  Follow-up with GI in 3 to 4 months.   I hope you have a great rest of your week!  Hennie Duos. Marletta Lor, D.O. Gastroenterology and Hepatology [facility]

## 2022-12-26 NOTE — Op Note (Signed)
[facility] Patient Name: Carmen Robles Procedure Date: 12/26/2022 7:49 AM MRN: [id number] Date of Birth: [birth year] Attending MD: Hennie Duos. Marletta Lor , [place], [id number] CSN: [id number] Age: 52 Admit Type: Outpatient Procedure:                Upper GI endoscopy Indications:              Dysphagia, Heartburn Providers:                Hennie Duos. Marletta Lor, DO, Jannett Celestine, RN, Pandora Leiter, Technician Referring MD:              Medicines:                See the Anesthesia note for documentation of the                            administered medications Complications:            No immediate complications. Estimated Blood Loss:     Estimated blood loss was minimal. Procedure:                Pre-Anesthesia Assessment:                           - The anesthesia plan was to use monitored                            anesthesia care (MAC).                           After obtaining informed consent, the endoscope was                            passed under direct vision. Throughout the                            procedure, the patient's blood pressure, pulse, and                            oxygen saturations were monitored continuously. The                            GIF-H190 ([id number]) scope was introduced through the                            mouth, and advanced to the second part of duodenum.                            The upper GI endoscopy was accomplished without                            difficulty. The patient tolerated the procedure                            well. Scope In: 8:03:30 AM  Scope Out: 8:09:39 AM Total Procedure Duration: 0 hours 6 minutes 9 seconds  Findings:      A 4 cm hiatal hernia was present.      A mild Schatzki ring was found in the distal esophagus. A TTS dilator       was passed through the scope. Dilation with an 18-19-20 mm balloon       dilator was performed to 20 mm. The dilation site was examined and       showed mild mucosal  disruption and moderate improvement in luminal       narrowing. Ring was then further disrupted with cold forceps.      Patchy mild inflammation characterized by erythema was found in the       gastric body. Biopsies were taken with a cold forceps for Helicobacter       pylori testing.      The duodenal bulb, first portion of the duodenum and second portion of       the duodenum were normal. Impression:               - 4 cm hiatal hernia.                           - Mild Schatzki ring. Dilated.                           - Gastritis. Biopsied.                           - Normal duodenal bulb, first portion of the                            duodenum and second portion of the duodenum. Moderate Sedation:      Per Anesthesia Care Recommendation:           - Patient has a contact number available for                            emergencies. The signs and symptoms of potential                            delayed complications were discussed with the                            patient. Return to normal activities tomorrow.                            Written discharge instructions were provided to the                            patient.                           - Resume previous diet.                           - Continue present medications.                           -  Await pathology results.                           - Repeat upper endoscopy PRN for retreatment.                           - Return to GI clinic in 3 months.                           - Use Prilosec (omeprazole) 40 mg PO BID for 12                            weeks. Procedure Code(s):        --- Professional ---                           [phone], Esophagogastroduodenoscopy, flexible,                            transoral; with transendoscopic balloon dilation of                            esophagus (less than 30 mm diameter)                           [id number], 59, Esophagogastroduodenoscopy, flexible,                             transoral; with biopsy, single or multiple Diagnosis Code(s):        --- Professional ---                           K44.9, Diaphragmatic hernia without obstruction or                            gangrene                           K22.2, Esophageal obstruction                           K29.70, Gastritis, unspecified, without bleeding                           R13.10, Dysphagia, unspecified                           R12, Heartburn CPT copyright 2022 American Medical Association. All rights reserved. The codes documented in this report are preliminary and upon coder review may  be revised to meet current compliance requirements. Hennie Duos. Marletta Lor, DO Hennie Duos. Marletta Lor, DO 12/26/2022 8:15:47 AM This report has been signed electronically. Number of Addenda: 0

## 2022-12-26 NOTE — Anesthesia Postprocedure Evaluation (Signed)
Anesthesia Post Note  Patient: Carmen Robles  Procedure(s) Performed: ESOPHAGOGASTRODUODENOSCOPY (EGD) WITH PROPOFOL BALLOON DILATION BIOPSY  Patient location during evaluation: Endoscopy Anesthesia Type: General Level of consciousness: awake and alert Pain management: pain level controlled Vital Signs Assessment: post-procedure vital signs reviewed and stable Respiratory status: spontaneous breathing Cardiovascular status: blood pressure returned to baseline and stable Postop Assessment: no apparent nausea or vomiting Anesthetic complications: no   No notable events documented.   Last Vitals:  Vitals:   12/26/22 0639 12/26/22 0642  BP:  113/70  Pulse: 76   Resp: 18   Temp: 36.8 C   SpO2: 98%     Last Pain:  Vitals:   12/26/22 0639  TempSrc: Oral  PainSc: 0-No pain                 Jadan Hinojos

## 2022-12-26 NOTE — Anesthesia Preprocedure Evaluation (Signed)
Anesthesia Evaluation  Patient identified by MRN, date of birth, ID band Patient awake    Reviewed: Allergy & Precautions, H&P , NPO status , Patient's Chart, lab work & pertinent test results, reviewed documented beta blocker date and time   Airway Mallampati: II  TM Distance: >3 FB Neck ROM: full    Dental no notable dental hx.    Pulmonary neg pulmonary ROS   Pulmonary exam normal breath sounds clear to auscultation       Cardiovascular Exercise Tolerance: Good negative cardio ROS  Rhythm:regular Rate:Normal     Neuro/Psych  Headaches  Neuromuscular disease negative neurological ROS  negative psych ROS   GI/Hepatic negative GI ROS, Neg liver ROS, hiatal hernia,GERD  ,,  Endo/Other  negative endocrine ROS    Renal/GU negative Renal ROS  negative genitourinary   Musculoskeletal   Abdominal   Peds  Hematology negative hematology ROS (+)   Anesthesia Other Findings   Reproductive/Obstetrics negative OB ROS                             Anesthesia Physical Anesthesia Plan  ASA: 2  Anesthesia Plan: General   Post-op Pain Management:    Induction:   PONV Risk Score and Plan: Propofol infusion  Airway Management Planned:   Additional Equipment:   Intra-op Plan:   Post-operative Plan:   Informed Consent: I have reviewed the patients History and Physical, chart, labs and discussed the procedure including the risks, benefits and alternatives for the proposed anesthesia with the patient or authorized representative who has indicated his/her understanding and acceptance.     Dental Advisory Given  Plan Discussed with: CRNA  Anesthesia Plan Comments:        Anesthesia Quick Evaluation

## 2022-12-26 NOTE — Telephone Encounter (Signed)
PA done for Omeprazole 40 mg on Cover My Meds. Tried/failed: Omeprazole 20 mg and Pantoprazole 40 mg. Dx used: GERD (K21.9) and R13.19 (Esophageal dysphagia). Pt's ov note sent as well. Waiting on a response from [facility]

## 2022-12-29 LAB — SURGICAL PATHOLOGY

## 2022-12-29 NOTE — Telephone Encounter (Signed)
Documentation from [employer] that the pt is approved 12 months through 12/26/2023. Given to Darl Pikes for scan to chart

## 2023-01-01 ENCOUNTER — Encounter (HOSPITAL_COMMUNITY): Payer: Self-pay | Admitting: Internal Medicine

## 2023-01-15 DIAGNOSIS — S233XXA Sprain of ligaments of thoracic spine, initial encounter: Secondary | ICD-10-CM | POA: Diagnosis not present

## 2023-01-15 DIAGNOSIS — M5440 Lumbago with sciatica, unspecified side: Secondary | ICD-10-CM | POA: Diagnosis not present

## 2023-01-15 DIAGNOSIS — S134XXA Sprain of ligaments of cervical spine, initial encounter: Secondary | ICD-10-CM | POA: Diagnosis not present

## 2023-01-15 DIAGNOSIS — S335XXA Sprain of ligaments of lumbar spine, initial encounter: Secondary | ICD-10-CM | POA: Diagnosis not present

## 2023-01-19 DIAGNOSIS — S233XXA Sprain of ligaments of thoracic spine, initial encounter: Secondary | ICD-10-CM | POA: Diagnosis not present

## 2023-01-19 DIAGNOSIS — M5440 Lumbago with sciatica, unspecified side: Secondary | ICD-10-CM | POA: Diagnosis not present

## 2023-01-19 DIAGNOSIS — S335XXA Sprain of ligaments of lumbar spine, initial encounter: Secondary | ICD-10-CM | POA: Diagnosis not present

## 2023-01-19 DIAGNOSIS — S134XXA Sprain of ligaments of cervical spine, initial encounter: Secondary | ICD-10-CM | POA: Diagnosis not present

## 2023-01-23 DIAGNOSIS — S233XXA Sprain of ligaments of thoracic spine, initial encounter: Secondary | ICD-10-CM | POA: Diagnosis not present

## 2023-01-23 DIAGNOSIS — S134XXA Sprain of ligaments of cervical spine, initial encounter: Secondary | ICD-10-CM | POA: Diagnosis not present

## 2023-01-23 DIAGNOSIS — M5440 Lumbago with sciatica, unspecified side: Secondary | ICD-10-CM | POA: Diagnosis not present

## 2023-01-23 DIAGNOSIS — S335XXA Sprain of ligaments of lumbar spine, initial encounter: Secondary | ICD-10-CM | POA: Diagnosis not present

## 2023-01-29 DIAGNOSIS — S233XXA Sprain of ligaments of thoracic spine, initial encounter: Secondary | ICD-10-CM | POA: Diagnosis not present

## 2023-01-29 DIAGNOSIS — S335XXA Sprain of ligaments of lumbar spine, initial encounter: Secondary | ICD-10-CM | POA: Diagnosis not present

## 2023-01-29 DIAGNOSIS — S134XXA Sprain of ligaments of cervical spine, initial encounter: Secondary | ICD-10-CM | POA: Diagnosis not present

## 2023-01-29 DIAGNOSIS — M5440 Lumbago with sciatica, unspecified side: Secondary | ICD-10-CM | POA: Diagnosis not present

## 2023-02-02 DIAGNOSIS — S335XXA Sprain of ligaments of lumbar spine, initial encounter: Secondary | ICD-10-CM | POA: Diagnosis not present

## 2023-02-02 DIAGNOSIS — S134XXA Sprain of ligaments of cervical spine, initial encounter: Secondary | ICD-10-CM | POA: Diagnosis not present

## 2023-02-02 DIAGNOSIS — M5440 Lumbago with sciatica, unspecified side: Secondary | ICD-10-CM | POA: Diagnosis not present

## 2023-02-02 DIAGNOSIS — S233XXA Sprain of ligaments of thoracic spine, initial encounter: Secondary | ICD-10-CM | POA: Diagnosis not present

## 2023-02-12 DIAGNOSIS — S134XXA Sprain of ligaments of cervical spine, initial encounter: Secondary | ICD-10-CM | POA: Diagnosis not present

## 2023-02-12 DIAGNOSIS — S335XXA Sprain of ligaments of lumbar spine, initial encounter: Secondary | ICD-10-CM | POA: Diagnosis not present

## 2023-02-12 DIAGNOSIS — M5440 Lumbago with sciatica, unspecified side: Secondary | ICD-10-CM | POA: Diagnosis not present

## 2023-02-12 DIAGNOSIS — S233XXA Sprain of ligaments of thoracic spine, initial encounter: Secondary | ICD-10-CM | POA: Diagnosis not present

## 2023-02-18 ENCOUNTER — Encounter: Payer: Self-pay | Admitting: Internal Medicine

## 2023-02-26 DIAGNOSIS — S335XXA Sprain of ligaments of lumbar spine, initial encounter: Secondary | ICD-10-CM | POA: Diagnosis not present

## 2023-02-26 DIAGNOSIS — S134XXA Sprain of ligaments of cervical spine, initial encounter: Secondary | ICD-10-CM | POA: Diagnosis not present

## 2023-02-26 DIAGNOSIS — S233XXA Sprain of ligaments of thoracic spine, initial encounter: Secondary | ICD-10-CM | POA: Diagnosis not present

## 2023-02-26 DIAGNOSIS — M5440 Lumbago with sciatica, unspecified side: Secondary | ICD-10-CM | POA: Diagnosis not present

## 2023-04-17 DIAGNOSIS — E663 Overweight: Secondary | ICD-10-CM | POA: Diagnosis not present

## 2023-04-17 DIAGNOSIS — M549 Dorsalgia, unspecified: Secondary | ICD-10-CM | POA: Diagnosis not present

## 2023-04-17 DIAGNOSIS — Z6826 Body mass index (BMI) 26.0-26.9, adult: Secondary | ICD-10-CM | POA: Diagnosis not present

## 2023-06-24 ENCOUNTER — Ambulatory Visit: Payer: BC Managed Care – PPO | Admitting: Gastroenterology

## 2023-06-26 ENCOUNTER — Ambulatory Visit: Payer: BC Managed Care – PPO | Admitting: Gastroenterology

## 2023-06-26 ENCOUNTER — Encounter: Payer: Self-pay | Admitting: Gastroenterology

## 2023-06-26 VITALS — BP 106/68 | HR 80 | Temp 97.4°F | Ht 60.0 in | Wt 137.6 lb

## 2023-06-26 DIAGNOSIS — K2970 Gastritis, unspecified, without bleeding: Secondary | ICD-10-CM

## 2023-06-26 DIAGNOSIS — K219 Gastro-esophageal reflux disease without esophagitis: Secondary | ICD-10-CM

## 2023-06-26 NOTE — Patient Instructions (Signed)
Collect stool for H.pylori. You will need to be off omeprazole for two weeks, no antibiotics or Pepto for two weeks prior to stool collection. If you need something for reflux during this time you can use Pepcid or TUMS both over the counter.  Once stool collected, start omeprazole before breakfast daily for four weeks. If you are feeling better at that time, you can decrease to every other day if tolerated.

## 2023-06-26 NOTE — Progress Notes (Signed)
GI Office Note    Referring Provider: Assunta Found, MD Primary Care Physician:  Assunta Found, MD  Primary Gastroenterologist: Hennie Duos. Marletta Lor, DO   Chief Complaint   Chief Complaint  Patient presents with   Follow-up    Wants to discuss her EGD results    History of Present Illness   Carmen Robles is a 52 y.o. female presenting today for follow-up. Last seen in the office in November 2023.  She has a history of chronic reflux, dysphagia.  Patient presents for follow-up today.  She notes after her endoscopy back in April her swallowing was much better the first 2 weeks.  After that she noted some return of her symptoms.  Although she notes that since really watching her diet, controlling her reflux, with some weight loss (approximately 20 pounds) that all of her symptoms have improved.  Back in August, she developed pain in the left mid back radiating around into the abdomen.  She saw her PCP who was concerned that she may have prodrome for shingles.  Patient had received 1 shingles vaccine but had a reaction was not able to get the second shot.  She was started on Valtrex.  She never developed a rash.  Her symptoms did resolve.  She has some back issues and periodically gets prednisone.  Recently has not been watching her diet as closely still trying to follow antireflux measures.  Taking omeprazole 40 mg twice daily as needed but does not take daily.  Certain foods can trigger heartburn, typically will take omeprazole if she develops symptoms.  This morning she did not eat breakfast and after having some coffee she had some nausea.  Takes ibuprofen for headaches but not having to take very often.  Bowel movements regular.  No blood in the stool or melena.    EGD April 2024: -4cm hh -mild schatzki ring s/p dilation -gastritis s/p bx, no H. pylori -use prilosec bid for 12 weeks  Colonoscopy February 2022: -diverticulosis -Nonbleeding internal hemorrhoids -one 5mm polyp  in transverse colon, hyperplastic polyp, recommended recall in 5 years due fo FH.  Medications   Current Outpatient Medications  Medication Sig Dispense Refill   Ibuprofen 200 MG CAPS Take 600 mg by mouth daily as needed (pain and headache).     omeprazole (PRILOSEC) 40 MG capsule Take 1 capsule (40 mg total) by mouth 2 (two) times daily. (Patient taking differently: Take 40 mg by mouth daily as needed.) 60 capsule 11   valACYclovir (VALTREX) 500 MG tablet Take 1 tablet (500 mg total) by mouth daily. (Patient taking differently: Take 500 mg by mouth daily as needed.) 90 tablet 4   No current facility-administered medications for this visit.    Allergies   Allergies as of 06/26/2023 - Review Complete 06/26/2023  Allergen Reaction Noted   Codeine Nausea And Vomiting 08/28/2012        Review of Systems   General: Negative for anorexia, weight loss, fever, chills, fatigue, weakness. ENT: Negative for hoarseness, difficulty swallowing , nasal congestion. CV: Negative for chest pain, angina, palpitations, dyspnea on exertion, peripheral edema.  Respiratory: Negative for dyspnea at rest, dyspnea on exertion, cough, sputum, wheezing.  GI: See history of present illness. GU:  Negative for dysuria, hematuria, urinary incontinence, urinary frequency, nocturnal urination.  Endo: Negative for unusual weight change.     Physical Exam   BP 106/68 (BP Location: Right Arm, Patient Position: Sitting, Cuff Size: Normal)   Pulse 80  Temp (!) 97.4 F (36.3 C) (Oral)   Ht 5' (1.524 m)   Wt 137 lb 9.6 oz (62.4 kg)   LMP 08/18/2011   SpO2 100%   BMI 26.87 kg/m    General: Well-nourished, well-developed in no acute distress.  Eyes: No icterus. Mouth: Oropharyngeal mucosa moist and pink   Abdomen: Bowel sounds are normal, nondistended, no hepatosplenomegaly or masses,  no abdominal bruits or hernia , no rebound or guarding. Epigastric tenderness, mild Rectal: not performed  Extremities: No  lower extremity edema. No clubbing or deformities. Neuro: Alert and oriented x 4   Skin: Warm and dry, no jaundice.   Psych: Alert and cooperative, normal mood and affect.  Labs   None available  Imaging Studies   No results found.  Assessment/Plan:   GERD/gastritis/LUQ pain -overall improved with weight loss, anti-reflux measures, taking PPI as needed only. -Recent flare and reflux when she has strayed from her diet. -Recent left upper quadrant pain radiating from the back possibly musculoskeletal, now resolved -Patient inquired about H. pylori, interested in additional testing, biopsies reassuring but have offered H. pylori stool testing -Collect stool for H. pylori after off PPI, and no antibiotics or Pepto-Bismol for 2 weeks, may use Pepcid or Tums for breakthrough symptoms during this time -Once stool collected, resume omeprazole taking once daily before breakfast for 4 weeks.  At that time if she is feeling better she can try to decrease to every other day.  Leanna Battles. Melvyn Neth, MHS, PA-C [facility]

## 2023-07-09 DIAGNOSIS — K219 Gastro-esophageal reflux disease without esophagitis: Secondary | ICD-10-CM | POA: Diagnosis not present

## 2023-07-09 DIAGNOSIS — K2990 Gastroduodenitis, unspecified, without bleeding: Secondary | ICD-10-CM | POA: Diagnosis not present

## 2023-07-09 DIAGNOSIS — K2970 Gastritis, unspecified, without bleeding: Secondary | ICD-10-CM | POA: Diagnosis not present

## 2023-07-11 LAB — H. PYLORI ANTIGEN, STOOL: H pylori Ag, Stl: NEGATIVE

## 2023-08-03 ENCOUNTER — Telehealth (HOSPITAL_BASED_OUTPATIENT_CLINIC_OR_DEPARTMENT_OTHER): Payer: Self-pay | Admitting: *Deleted

## 2023-08-03 NOTE — Telephone Encounter (Signed)
Patient called and would like to be seen today stated she needs be seen today ?

## 2023-08-05 ENCOUNTER — Ambulatory Visit (HOSPITAL_BASED_OUTPATIENT_CLINIC_OR_DEPARTMENT_OTHER): Payer: BC Managed Care – PPO | Admitting: Certified Nurse Midwife

## 2023-08-05 ENCOUNTER — Encounter (HOSPITAL_BASED_OUTPATIENT_CLINIC_OR_DEPARTMENT_OTHER): Payer: Self-pay | Admitting: Certified Nurse Midwife

## 2023-08-05 VITALS — BP 109/63 | HR 73 | Ht 60.0 in | Wt 138.6 lb

## 2023-08-05 DIAGNOSIS — R232 Flushing: Secondary | ICD-10-CM

## 2023-08-05 DIAGNOSIS — N816 Rectocele: Secondary | ICD-10-CM

## 2023-08-05 DIAGNOSIS — N952 Postmenopausal atrophic vaginitis: Secondary | ICD-10-CM | POA: Diagnosis not present

## 2023-08-05 MED ORDER — ESTRADIOL 0.05 MG/24HR TD PTTW
1.0000 | MEDICATED_PATCH | TRANSDERMAL | 4 refills | Status: DC
Start: 2023-08-06 — End: 2023-10-29

## 2023-08-05 NOTE — Progress Notes (Signed)
GYNECOLOGY  VISIT  CC:   hot flashes, night sweats, fatigue, prolape?  HPI: 52 y.o. G62P2002 Married White or Caucasian female here for problem gyn visit. Pt states that she intermittently feels like something is bulging at her vaginal introitus. She denies urinary stress or urge incontinence. States bowel movements typically  normal but when she is constipated it exacerbates the sensation of fullness. States nothing has every prolapsed completely out of vaginal introitus, but she sometimes sees a bulge at the introitus. She also reports very bothersome hot flashes, night sweats, decreased libido, fatigue-feels like these symptoms are likely due to perimenopause/menopause. Hx Hysterectomy, ovaries intact. Her supportive spouse accompanies her today.   Past Medical History:  Diagnosis Date   Abnormal Pap smear    GERD (gastroesophageal reflux disease)    Headache(784.0)    Hiatal hernia    HSV (herpes simplex virus) infection     MEDS:   Current Outpatient Medications on File Prior to Visit  Medication Sig Dispense Refill   BIOTIN PO Take by mouth.     Ibuprofen 200 MG CAPS Take 600 mg by mouth daily as needed (pain and headache).     valACYclovir (VALTREX) 500 MG tablet Take 1 tablet (500 mg total) by mouth daily. (Patient taking differently: Take 500 mg by mouth daily as needed.) 90 tablet 4   Vitamin D, Ergocalciferol, (DRISDOL) 1.25 MG (50000 UNIT) CAPS capsule Take 50,000 Units by mouth every 7 (seven) days.     omeprazole (PRILOSEC) 40 MG capsule Take 1 capsule (40 mg total) by mouth 2 (two) times daily. (Patient not taking: Reported on 08/05/2023) 60 capsule 11   No current facility-administered medications on file prior to visit.    ALLERGIES: Codeine  SH:  lives with spouse, works full-time  ROS  PHYSICAL EXAMINATION:    BP 109/63 (BP Location: Right Arm, Patient Position: Sitting, Cuff Size: Normal)   Pulse 73   Ht 5' (1.524 m)   Wt 138 lb 9.6 oz (62.9 kg)   LMP  08/18/2011   BMI 27.07 kg/m     General appearance: alert, cooperative and appears stated age  Pelvic: External genitalia:  no lesions              Urethra:  normal appearing urethra with no masses, tenderness or lesions              Bartholins and Skenes: normal                 Vagina: normal mucosa without prolapse or lesions and mild cystocele, rectocele palpable with valsalva.              Cervix: absent              Bimanual Exam:  Uterus:  uterus absent              Adnexa: no mass, fullness, tenderness               Anus:  normal sphincter tone, no lesions  Chaperone, Hendricks Milo, CMA, was present for exam.  Assessment/Plan: 1. Hot flashes - estradiol (VIVELLE-DOT) 0.05 MG/24HR patch; Place 1 patch (0.05 mg total) onto the skin 2 (two) times a week.  Dispense: 24 patch; Refill: 4  2. Vaginal atrophy - estradiol (VIVELLE-DOT) 0.05 MG/24HR patch; Place 1 patch (0.05 mg total) onto the skin 2 (two) times a week.  Dispense: 24 patch; Refill: 4  3. Rectocele/Cystocele - Discussed availability Pelvic Floor Physical Therapy -  Kegel's explained and encouraged - Discussed surgical options may be available and would refer to UroGynecology if desired. - estradiol (VIVELLE-DOT) 0.05 MG/24HR patch; Place 1 patch (0.05 mg total) onto the skin 2 (two) times a week.  Dispense: 24 patch; Refill: 4  Pt has routine annual gyn exam scheduled in 2 months. Follow-up at that time with effectiveness of estradiol patch at reducing hot flashes/night sweats.  Carmen Robles

## 2023-08-07 DIAGNOSIS — N816 Rectocele: Secondary | ICD-10-CM | POA: Insufficient documentation

## 2023-08-07 DIAGNOSIS — N952 Postmenopausal atrophic vaginitis: Secondary | ICD-10-CM | POA: Insufficient documentation

## 2023-09-04 ENCOUNTER — Ambulatory Visit (HOSPITAL_BASED_OUTPATIENT_CLINIC_OR_DEPARTMENT_OTHER): Payer: BC Managed Care – PPO | Admitting: Certified Nurse Midwife

## 2023-09-24 ENCOUNTER — Ambulatory Visit (HOSPITAL_BASED_OUTPATIENT_CLINIC_OR_DEPARTMENT_OTHER): Payer: BC Managed Care – PPO | Admitting: Obstetrics & Gynecology

## 2023-09-25 ENCOUNTER — Ambulatory Visit (HOSPITAL_BASED_OUTPATIENT_CLINIC_OR_DEPARTMENT_OTHER): Payer: BC Managed Care – PPO | Admitting: Obstetrics & Gynecology

## 2023-09-25 ENCOUNTER — Ambulatory Visit (HOSPITAL_BASED_OUTPATIENT_CLINIC_OR_DEPARTMENT_OTHER): Payer: BC Managed Care – PPO | Admitting: Certified Nurse Midwife

## 2023-09-25 ENCOUNTER — Other Ambulatory Visit (HOSPITAL_BASED_OUTPATIENT_CLINIC_OR_DEPARTMENT_OTHER): Payer: Self-pay | Admitting: Obstetrics & Gynecology

## 2023-09-25 DIAGNOSIS — B009 Herpesviral infection, unspecified: Secondary | ICD-10-CM

## 2023-10-22 NOTE — Progress Notes (Signed)
ANNUAL EXAM Patient name: Carmen Robles MRN [id number]  Date of birth: [birth year] Chief Complaint:   Annual Exam  History of Present Illness:   Carmen Robles is a 53 y.o. G61P2002 Caucasian female being seen today for a routine annual exam.   Current complaints: Pt states Vivelle-Dot is helping "a lot" with the hot flashes and night sweats. She estimates she has had 3 night sweats in the past month. She would like to continue the patch. States she was given a generic patch at one point and it did not work at all, she then requested to go back to brand-name and finds it effective at reducing hot flashes and night sweats. Pt takes Valtrex 500mg  po daily as genital HSV suppressive therapy. She would like routine labs drawn today.   Patient's last menstrual period was 08/18/2011.  Last mammogram: 2022 or 2023. Results were: normal. Pt encouraged to get mammogram today at [facility] after this appointment.  Last colonoscopy: 2022. Pt states she is UTD on Colonoscopy (had one polyp). Plans repeat Colonoscopy as scheduled. Pt had Shingles Vaccine x 1 and developed a reaction to it, cannot receive dose #2.      10/29/2023    8:59 AM 08/05/2023    8:27 AM 09/11/2022    2:14 PM 07/24/2021    3:33 PM  Depression screen PHQ 2/9  Decreased Interest 0 0 0 0  Down, Depressed, Hopeless 0 0 0 0  PHQ - 2 Score 0 0 0 0         No data to display           Review of Systems:   Pertinent items are noted in HPI  Pertinent History Reviewed:  Reviewed past medical,surgical, social and family history.  Reviewed problem list, medications and allergies. Physical Assessment:   Vitals:   10/29/23 0849  BP: 114/75  Pulse: 71  Weight: 146 lb 3.2 oz (66.3 kg)  Height: 5' (1.524 m)  Body mass index is 28.55 kg/m.        Physical Examination:   General appearance - well appearing, and in no distress  Mental status - alert, oriented to person, place, and time  Psych:  She has a normal mood and  affect  Skin - warm and dry, normal color, no suspicious lesions noted  Chest - effort normal, all lung fields clear to auscultation bilaterally  Heart - normal rate and regular rhythm  Neck:  midline trachea, no thyromegaly or nodules  Breasts - breasts appear normal, no suspicious masses, no skin or nipple changes or  axillary nodes  Abdomen - soft, nontender, nondistended, no masses or organomegaly  Pelvic - VULVA: normal appearing vulva with no masses, tenderness or lesions  VAGINA: normal appearing vagina with normal color and discharge, no lesions   UTERUS: absent  ADNEXA: No adnexal masses or tenderness noted.  Rectal - normal rectal, good sphincter tone  Extremities:  No swelling or varicosities noted  Chaperone present for exam  No results found for this or any previous visit (from the past 24 hours).  Assessment & Plan:  1) Well-Woman Exam - Annual screening mammogram encouraged (pt will consider mammogram today at [facility]) - Continue breast self-awareness - Pap smear not indicated per ASCCP guidelines (hysterectomy/benign disease) - Pt will repeat Colonoscopy as scheduled due to hx Polyp  2) Hx Genital HSV - Continue Valtrex 500mg  po daily. Increase to 1,000mg  daily if outbreak imminent.  3) Vasomotor Symptoms Menopause - May continue  Vivelle-Dot .05mg  patch twice weekly  Labs/procedures today: routine labs desired by pt  Orders Placed This Encounter  Procedures   MM 3D SCREENING MAMMOGRAM BILATERAL BREAST   Lipid panel   TSH   Hemoglobin A1c   FSH   Vitamin D, 25-Hydroxy, Total   Hepatitis C Antibody   HIV antibody (with reflex)   RPR   Hepatitis B Surface AntiGEN    Meds:  Meds ordered this encounter  Medications   estradiol (VIVELLE-DOT) 0.05 MG/24HR patch    Sig: Place 1 patch (0.05 mg total) onto the skin 2 (two) times a week.    Dispense:  24 patch    Refill:  4    Supervising Provider:   Jerene Bears [[id number]]   valACYclovir (VALTREX) 500 MG tablet     Sig: Take 1 tablet (500 mg total) by mouth daily.    Dispense:  90 tablet    Refill:  4    Supervising Provider:   Valentina Shaggy S [[id number]]   Vitamin D, Ergocalciferol, (DRISDOL) 1.25 MG (50000 UNIT) CAPS capsule    Sig: Take 1 capsule (50,000 Units total) by mouth every 7 (seven) days.    Dispense:  12 capsule    Refill:  3    Supervising Provider:   Jerene Bears [[id number]]    Follow-up: No follow-ups on file.  Letta Kocher, CNM 10/29/2023 9:31 AM

## 2023-10-29 ENCOUNTER — Ambulatory Visit (HOSPITAL_BASED_OUTPATIENT_CLINIC_OR_DEPARTMENT_OTHER): Payer: BC Managed Care – PPO | Admitting: Certified Nurse Midwife

## 2023-10-29 ENCOUNTER — Ambulatory Visit (HOSPITAL_BASED_OUTPATIENT_CLINIC_OR_DEPARTMENT_OTHER)
Admission: RE | Admit: 2023-10-29 | Discharge: 2023-10-29 | Disposition: A | Discharge status: Home / Self Care | Payer: BC Managed Care – PPO | Source: Ambulatory Visit | Attending: Certified Nurse Midwife | Admitting: Certified Nurse Midwife

## 2023-10-29 ENCOUNTER — Encounter (HOSPITAL_BASED_OUTPATIENT_CLINIC_OR_DEPARTMENT_OTHER): Payer: Self-pay | Admitting: Radiology

## 2023-10-29 ENCOUNTER — Encounter (HOSPITAL_BASED_OUTPATIENT_CLINIC_OR_DEPARTMENT_OTHER): Payer: Self-pay | Admitting: Certified Nurse Midwife

## 2023-10-29 VITALS — BP 114/75 | HR 71 | Ht 60.0 in | Wt 146.2 lb

## 2023-10-29 DIAGNOSIS — N951 Menopausal and female climacteric states: Secondary | ICD-10-CM

## 2023-10-29 DIAGNOSIS — B009 Herpesviral infection, unspecified: Secondary | ICD-10-CM

## 2023-10-29 DIAGNOSIS — R232 Flushing: Secondary | ICD-10-CM | POA: Diagnosis not present

## 2023-10-29 DIAGNOSIS — N816 Rectocele: Secondary | ICD-10-CM

## 2023-10-29 DIAGNOSIS — N952 Postmenopausal atrophic vaginitis: Secondary | ICD-10-CM

## 2023-10-29 DIAGNOSIS — Z1231 Encounter for screening mammogram for malignant neoplasm of breast: Secondary | ICD-10-CM

## 2023-10-29 DIAGNOSIS — Z01419 Encounter for gynecological examination (general) (routine) without abnormal findings: Secondary | ICD-10-CM | POA: Diagnosis not present

## 2023-10-29 DIAGNOSIS — Z113 Encounter for screening for infections with a predominantly sexual mode of transmission: Secondary | ICD-10-CM

## 2023-10-29 MED ORDER — VITAMIN D (ERGOCALCIFEROL) 1.25 MG (50000 UNIT) PO CAPS: 50000.0000 [IU] | ORAL_CAPSULE | ORAL | 3 refills | Status: AC

## 2023-10-29 MED ORDER — ESTRADIOL 0.05 MG/24HR TD PTTW
1.0000 | MEDICATED_PATCH | TRANSDERMAL | 4 refills | Status: AC
Start: 2023-10-29 — End: ?

## 2023-10-29 MED ORDER — VALACYCLOVIR HCL 500 MG PO TABS
500.0000 mg | ORAL_TABLET | Freq: Every day | ORAL | 4 refills | Status: AC
Start: 2023-10-29 — End: ?

## 2023-10-30 LAB — VITAMIN D 25 HYDROXY (VIT D DEFICIENCY, FRACTURES): Vit D, 25-Hydroxy: 38.2 ng/mL (ref 30.0–100.0)

## 2023-10-30 LAB — LIPID PANEL
Chol/HDL Ratio: 2.6 {ratio} (ref 0.0–4.4)
Cholesterol, Total: 251 mg/dL — ABNORMAL HIGH (ref 100–199)
HDL: 95 mg/dL (ref >39)
LDL Chol Calc (NIH): 135 mg/dL — ABNORMAL HIGH (ref 0–99)
Triglycerides: 122 mg/dL (ref 0–149)
VLDL Cholesterol Cal: 21 mg/dL (ref 5–40)

## 2023-10-30 LAB — HEMOGLOBIN A1C
Est. average glucose Bld gHb Est-mCnc: 114 mg/dL
Hgb A1c MFr Bld: 5.6 % (ref 4.8–5.6)

## 2023-10-30 LAB — HEPATITIS B SURFACE ANTIGEN: Hepatitis B Surface Ag: NEGATIVE

## 2023-10-30 LAB — RPR: RPR Ser Ql: NONREACTIVE

## 2023-10-30 LAB — HIV ANTIBODY (ROUTINE TESTING W REFLEX): HIV Screen 4th Generation wRfx: NONREACTIVE

## 2023-10-30 LAB — HEPATITIS C ANTIBODY: Hep C Virus Ab: NONREACTIVE

## 2023-10-30 LAB — TSH: TSH: 2.95 u[IU]/mL (ref 0.450–4.500)

## 2023-10-30 LAB — FOLLICLE STIMULATING HORMONE: FSH: 17.6 m[IU]/mL

## 2023-11-12 ENCOUNTER — Telehealth (HOSPITAL_BASED_OUTPATIENT_CLINIC_OR_DEPARTMENT_OTHER): Payer: Self-pay | Admitting: Certified Nurse Midwife

## 2023-11-12 NOTE — Telephone Encounter (Signed)
 Patient called and would like for Lupita Leash to please call her about medication.

## 2023-11-13 ENCOUNTER — Telehealth (HOSPITAL_BASED_OUTPATIENT_CLINIC_OR_DEPARTMENT_OTHER): Payer: Self-pay | Admitting: Certified Nurse Midwife

## 2023-11-13 ENCOUNTER — Encounter (HOSPITAL_BASED_OUTPATIENT_CLINIC_OR_DEPARTMENT_OTHER): Payer: Self-pay | Admitting: Certified Nurse Midwife

## 2023-11-13 NOTE — Telephone Encounter (Signed)
 No answer. Left a voicemail message letting patient know that I tried to call her back. Pt instructed to send MyChart message or call us back on Monday morning.  Letta Kocher

## 2023-11-13 NOTE — Telephone Encounter (Signed)
 See telephone note. I returned her call but had to leave  a message. Carmen Robles

## 2023-11-16 ENCOUNTER — Encounter (HOSPITAL_BASED_OUTPATIENT_CLINIC_OR_DEPARTMENT_OTHER): Payer: Self-pay | Admitting: Certified Nurse Midwife

## 2023-11-16 ENCOUNTER — Other Ambulatory Visit (HOSPITAL_BASED_OUTPATIENT_CLINIC_OR_DEPARTMENT_OTHER): Payer: Self-pay | Admitting: Certified Nurse Midwife

## 2023-11-16 DIAGNOSIS — N952 Postmenopausal atrophic vaginitis: Secondary | ICD-10-CM

## 2023-11-16 DIAGNOSIS — R232 Flushing: Secondary | ICD-10-CM

## 2023-11-16 DIAGNOSIS — N816 Rectocele: Secondary | ICD-10-CM

## 2023-11-16 MED ORDER — ESTRADIOL 0.1 MG/24HR TD PTTW: 1.0000 | MEDICATED_PATCH | TRANSDERMAL | 12 refills | Status: AC

## 2023-12-19 DIAGNOSIS — R110 Nausea: Secondary | ICD-10-CM | POA: Diagnosis not present

## 2023-12-19 DIAGNOSIS — R509 Fever, unspecified: Secondary | ICD-10-CM | POA: Diagnosis not present

## 2023-12-19 DIAGNOSIS — J309 Allergic rhinitis, unspecified: Secondary | ICD-10-CM | POA: Diagnosis not present

## 2023-12-19 DIAGNOSIS — J019 Acute sinusitis, unspecified: Secondary | ICD-10-CM | POA: Diagnosis not present

## 2024-01-07 DIAGNOSIS — Z6828 Body mass index (BMI) 28.0-28.9, adult: Secondary | ICD-10-CM | POA: Diagnosis not present

## 2024-01-07 DIAGNOSIS — R7309 Other abnormal glucose: Secondary | ICD-10-CM | POA: Diagnosis not present

## 2024-01-07 DIAGNOSIS — E663 Overweight: Secondary | ICD-10-CM | POA: Diagnosis not present
